# Patient Record
Sex: Female | Born: 1950 | Race: White | Hispanic: No | Marital: Married | Smoking: Never smoker
Health system: Southern US, Community
[De-identification: ages and names within clinical notes are randomized; demographics above are authoritative.]

## PROBLEM LIST (undated history)

## (undated) DIAGNOSIS — K56609 Unspecified intestinal obstruction, unspecified as to partial versus complete obstruction: Secondary | ICD-10-CM

## (undated) DIAGNOSIS — M5020 Other cervical disc displacement, unspecified cervical region: Secondary | ICD-10-CM

## (undated) DIAGNOSIS — M25562 Pain in left knee: Secondary | ICD-10-CM

## (undated) DIAGNOSIS — E039 Hypothyroidism, unspecified: Secondary | ICD-10-CM

## (undated) DIAGNOSIS — C801 Malignant (primary) neoplasm, unspecified: Secondary | ICD-10-CM

## (undated) DIAGNOSIS — K219 Gastro-esophageal reflux disease without esophagitis: Secondary | ICD-10-CM

## (undated) DIAGNOSIS — E119 Type 2 diabetes mellitus without complications: Secondary | ICD-10-CM

## (undated) DIAGNOSIS — M1990 Unspecified osteoarthritis, unspecified site: Secondary | ICD-10-CM

## (undated) DIAGNOSIS — I10 Essential (primary) hypertension: Secondary | ICD-10-CM

## (undated) DIAGNOSIS — G4730 Sleep apnea, unspecified: Secondary | ICD-10-CM

---

## 2005-05-30 ENCOUNTER — Ambulatory Visit: Payer: Self-pay | Admitting: Internal Medicine

## 2005-06-07 ENCOUNTER — Ambulatory Visit: Payer: Self-pay | Admitting: Internal Medicine

## 2005-06-17 ENCOUNTER — Ambulatory Visit: Payer: Self-pay | Admitting: Internal Medicine

## 2005-10-04 ENCOUNTER — Ambulatory Visit: Payer: Self-pay | Admitting: Gastroenterology

## 2005-10-07 ENCOUNTER — Ambulatory Visit: Payer: Self-pay | Admitting: Gastroenterology

## 2006-07-23 ENCOUNTER — Emergency Department (HOSPITAL_COMMUNITY): Admission: EM | Admit: 2006-07-23 | Discharge: 2006-07-23 | Payer: Self-pay | Admitting: Emergency Medicine

## 2006-08-27 ENCOUNTER — Ambulatory Visit: Payer: Self-pay | Admitting: Internal Medicine

## 2006-09-20 ENCOUNTER — Ambulatory Visit: Payer: Self-pay

## 2007-04-13 ENCOUNTER — Ambulatory Visit: Payer: Self-pay | Admitting: Internal Medicine

## 2008-03-15 ENCOUNTER — Ambulatory Visit: Payer: Self-pay | Admitting: Internal Medicine

## 2009-03-29 ENCOUNTER — Ambulatory Visit: Payer: Self-pay | Admitting: Internal Medicine

## 2010-04-04 ENCOUNTER — Ambulatory Visit: Payer: Self-pay | Admitting: Internal Medicine

## 2011-04-25 ENCOUNTER — Inpatient Hospital Stay: Payer: Self-pay | Admitting: Surgery

## 2011-05-08 ENCOUNTER — Inpatient Hospital Stay: Payer: Self-pay | Admitting: Surgery

## 2011-06-11 ENCOUNTER — Observation Stay: Payer: Self-pay | Admitting: Surgery

## 2011-08-21 ENCOUNTER — Ambulatory Visit: Payer: Self-pay | Admitting: Internal Medicine

## 2012-09-23 ENCOUNTER — Ambulatory Visit: Payer: Self-pay | Admitting: Internal Medicine

## 2013-05-12 ENCOUNTER — Ambulatory Visit: Payer: Self-pay | Admitting: Ophthalmology

## 2013-06-02 ENCOUNTER — Ambulatory Visit: Payer: Self-pay | Admitting: Ophthalmology

## 2013-09-28 ENCOUNTER — Ambulatory Visit: Payer: Self-pay | Admitting: Internal Medicine

## 2014-04-28 ENCOUNTER — Inpatient Hospital Stay: Payer: Self-pay | Admitting: Surgery

## 2014-04-28 ENCOUNTER — Ambulatory Visit: Payer: Self-pay | Admitting: Physician Assistant

## 2014-04-28 LAB — COMPREHENSIVE METABOLIC PANEL
ALT: 35 U/L
AST: 26 U/L (ref 15–37)
Albumin: 3.9 g/dL (ref 3.4–5.0)
Alkaline Phosphatase: 84 U/L
Anion Gap: 8 (ref 7–16)
BILIRUBIN TOTAL: 0.4 mg/dL (ref 0.2–1.0)
BUN: 12 mg/dL (ref 7–18)
CALCIUM: 9.1 mg/dL (ref 8.5–10.1)
CO2: 27 mmol/L (ref 21–32)
CREATININE: 0.77 mg/dL (ref 0.60–1.30)
Chloride: 99 mmol/L (ref 98–107)
EGFR (African American): >60
EGFR (Non-African Amer.): >60
GLUCOSE: 115 mg/dL — AB (ref 65–99)
OSMOLALITY: 269 (ref 275–301)
Potassium: 3.8 mmol/L (ref 3.5–5.1)
Sodium: 134 mmol/L — ABNORMAL LOW (ref 136–145)
TOTAL PROTEIN: 7.7 g/dL (ref 6.4–8.2)

## 2014-04-28 LAB — CBC
HCT: 46 % (ref 35.0–47.0)
HGB: 15.1 g/dL (ref 12.0–16.0)
MCH: 29.7 pg (ref 26.0–34.0)
MCHC: 32.7 g/dL (ref 32.0–36.0)
MCV: 91 fL (ref 80–100)
PLATELETS: 223 10*3/uL (ref 150–440)
RBC: 5.07 10*6/uL (ref 3.80–5.20)
RDW: 13.3 % (ref 11.5–14.5)
WBC: 13.4 10*3/uL — AB (ref 3.6–11.0)

## 2014-04-28 LAB — URINALYSIS, COMPLETE
Bacteria: NONE SEEN
Bilirubin,UR: NEGATIVE
Blood: NEGATIVE
Glucose,UR: NEGATIVE mg/dL (ref 0–75)
Ketone: NEGATIVE
Nitrite: NEGATIVE
Ph: 6 (ref 4.5–8.0)
Protein: NEGATIVE
RBC,UR: 1 /HPF (ref 0–5)
Specific Gravity: 1.032 (ref 1.003–1.030)
WBC UR: 2 /HPF (ref 0–5)

## 2014-04-28 LAB — LIPASE, BLOOD: Lipase: 87 U/L (ref 73–393)

## 2014-04-29 LAB — CBC WITH DIFFERENTIAL/PLATELET
BASOS ABS: 0.1 10*3/uL (ref 0.0–0.1)
BASOS PCT: 0.6 %
EOS ABS: 0.2 10*3/uL (ref 0.0–0.7)
EOS PCT: 2.1 %
HCT: 40.8 % (ref 35.0–47.0)
HGB: 12.9 g/dL (ref 12.0–16.0)
LYMPHS ABS: 1.9 10*3/uL (ref 1.0–3.6)
LYMPHS PCT: 20.2 %
MCH: 29.1 pg (ref 26.0–34.0)
MCHC: 31.6 g/dL — AB (ref 32.0–36.0)
MCV: 92 fL (ref 80–100)
MONOS PCT: 9.3 %
Monocyte #: 0.9 x10 3/mm (ref 0.2–0.9)
Neutrophil #: 6.4 10*3/uL (ref 1.4–6.5)
Neutrophil %: 67.8 %
Platelet: 178 10*3/uL (ref 150–440)
RBC: 4.43 10*6/uL (ref 3.80–5.20)
RDW: 13.3 % (ref 11.5–14.5)
WBC: 9.4 10*3/uL (ref 3.6–11.0)

## 2014-04-29 LAB — BASIC METABOLIC PANEL
ANION GAP: 4 — AB (ref 7–16)
BUN: 10 mg/dL (ref 7–18)
CALCIUM: 8 mg/dL — AB (ref 8.5–10.1)
CHLORIDE: 105 mmol/L (ref 98–107)
CO2: 31 mmol/L (ref 21–32)
Creatinine: 0.69 mg/dL (ref 0.60–1.30)
EGFR (African American): >60
EGFR (Non-African Amer.): >60
Glucose: 107 mg/dL — ABNORMAL HIGH (ref 65–99)
OSMOLALITY: 279 (ref 275–301)
Potassium: 3.9 mmol/L (ref 3.5–5.1)
SODIUM: 140 mmol/L (ref 136–145)

## 2014-06-12 ENCOUNTER — Inpatient Hospital Stay: Payer: Self-pay | Admitting: Surgery

## 2014-06-12 LAB — URINALYSIS, COMPLETE
BILIRUBIN, UR: NEGATIVE
BLOOD: NEGATIVE
Bacteria: NONE SEEN
GLUCOSE, UR: NEGATIVE mg/dL (ref 0–75)
LEUKOCYTE ESTERASE: NEGATIVE
NITRITE: NEGATIVE
PH: 6 (ref 4.5–8.0)
Protein: NEGATIVE
RBC,UR: 2 /HPF (ref 0–5)
Specific Gravity: 1.027 (ref 1.003–1.030)
WBC UR: 4 /HPF (ref 0–5)

## 2014-06-12 LAB — CBC WITH DIFFERENTIAL/PLATELET
BASOS ABS: 0.1 10*3/uL (ref 0.0–0.1)
BASOS PCT: 0.6 %
EOS ABS: 0.1 10*3/uL (ref 0.0–0.7)
EOS PCT: 0.7 %
HCT: 46.6 % (ref 35.0–47.0)
HGB: 15.3 g/dL (ref 12.0–16.0)
LYMPHS ABS: 1.1 10*3/uL (ref 1.0–3.6)
LYMPHS PCT: 9.1 %
MCH: 30.1 pg (ref 26.0–34.0)
MCHC: 32.8 g/dL (ref 32.0–36.0)
MCV: 92 fL (ref 80–100)
MONO ABS: 0.5 x10 3/mm (ref 0.2–0.9)
Monocyte %: 4.5 %
NEUTROS ABS: 10.3 10*3/uL — AB (ref 1.4–6.5)
NEUTROS PCT: 85.1 %
PLATELETS: 194 10*3/uL (ref 150–440)
RBC: 5.08 10*6/uL (ref 3.80–5.20)
RDW: 13.2 % (ref 11.5–14.5)
WBC: 12.1 10*3/uL — AB (ref 3.6–11.0)

## 2014-06-12 LAB — COMPREHENSIVE METABOLIC PANEL
ALT: 37 U/L
ANION GAP: 9 (ref 7–16)
AST: 33 U/L (ref 15–37)
Albumin: 4.1 g/dL (ref 3.4–5.0)
Alkaline Phosphatase: 99 U/L
BUN: 18 mg/dL (ref 7–18)
Bilirubin,Total: 0.5 mg/dL (ref 0.2–1.0)
CREATININE: 0.9 mg/dL (ref 0.60–1.30)
Calcium, Total: 9.3 mg/dL (ref 8.5–10.1)
Chloride: 98 mmol/L (ref 98–107)
Co2: 30 mmol/L (ref 21–32)
EGFR (African American): >60
Glucose: 190 mg/dL — ABNORMAL HIGH (ref 65–99)
Osmolality: 281 (ref 275–301)
Potassium: 4.2 mmol/L (ref 3.5–5.1)
SODIUM: 137 mmol/L (ref 136–145)
Total Protein: 8.6 g/dL — ABNORMAL HIGH (ref 6.4–8.2)

## 2014-06-12 LAB — TROPONIN I: Troponin-I: <0.02 ng/mL

## 2014-06-12 LAB — LIPASE, BLOOD: Lipase: 104 U/L (ref 73–393)

## 2014-06-13 LAB — COMPREHENSIVE METABOLIC PANEL
Albumin: 3 g/dL — ABNORMAL LOW (ref 3.4–5.0)
Alkaline Phosphatase: 69 U/L
Anion Gap: 7 (ref 7–16)
BILIRUBIN TOTAL: 0.6 mg/dL (ref 0.2–1.0)
BUN: 14 mg/dL (ref 7–18)
CHLORIDE: 105 mmol/L (ref 98–107)
CREATININE: 0.65 mg/dL (ref 0.60–1.30)
Calcium, Total: 8.1 mg/dL — ABNORMAL LOW (ref 8.5–10.1)
Co2: 30 mmol/L (ref 21–32)
EGFR (African American): >60
Glucose: 112 mg/dL — ABNORMAL HIGH (ref 65–99)
OSMOLALITY: 284 (ref 275–301)
Potassium: 3.9 mmol/L (ref 3.5–5.1)
SGOT(AST): 30 U/L (ref 15–37)
SGPT (ALT): 33 U/L
SODIUM: 142 mmol/L (ref 136–145)
Total Protein: 6.3 g/dL — ABNORMAL LOW (ref 6.4–8.2)

## 2014-06-13 LAB — CBC WITH DIFFERENTIAL/PLATELET
Basophil #: 0 10*3/uL (ref 0.0–0.1)
Basophil %: 0.5 %
EOS ABS: 0.2 10*3/uL (ref 0.0–0.7)
Eosinophil %: 2.6 %
HCT: 39 % (ref 35.0–47.0)
HGB: 13.2 g/dL (ref 12.0–16.0)
LYMPHS ABS: 1.4 10*3/uL (ref 1.0–3.6)
Lymphocyte %: 23.4 %
MCH: 30.6 pg (ref 26.0–34.0)
MCHC: 33.9 g/dL (ref 32.0–36.0)
MCV: 90 fL (ref 80–100)
MONOS PCT: 7.6 %
Monocyte #: 0.4 x10 3/mm (ref 0.2–0.9)
NEUTROS ABS: 3.9 10*3/uL (ref 1.4–6.5)
Neutrophil %: 65.9 %
PLATELETS: 163 10*3/uL (ref 150–440)
RBC: 4.32 10*6/uL (ref 3.80–5.20)
RDW: 12.9 % (ref 11.5–14.5)
WBC: 5.9 10*3/uL (ref 3.6–11.0)

## 2014-11-19 NOTE — Discharge Summary (Signed)
PATIENT NAME:  Susan Chung, Susan Chung MR#:  [id number] DATE OF BIRTH:  04-20-1951  DATE OF ADMISSION:  04/28/2014 DATE OF DISCHARGE:  05/01/2014  BRIEF HISTORY: Ms. Elenes is a 65 year old woman with an abdominal wall hernia repair 2 years ago, complicated by a wound infection. No mesh was placed. She developed a recurrent hernia. She presented to the emergency room with diarrhea, abdominal pain and mild obstructive symptoms. A CT scan was suggestive of an incarcerated hernia. The hernia was reduced in the emergency room, but she was admitted for further evaluation. She was made n.p.o. for the first 12 hours, had no particular abdominal problems. Continued to have some mild diarrhea and was advanced to a liquid diet. She tolerated kit well. She moved to a soft diet with no particular problems. Her hernia remains reduced. She appears to be stable with regard to her abdominal discomfort. We talked about the options for intervention, which would include consideration for repair. She may need evaluation by a body wall reconstruction expert. We will set up a time for her to see [place] in the office.   DISCHARGE MEDICATIONS: Include levothyroxine 125 mcg once a day, omeprazole 20 mg once a day, lisinopril 2.5 mg once a day, meclizine 25 mg 3 times a day, metformin 500 mg b.i.d. and hydrochlorothiazide-triamterene 25/37.5 once a day. She is also discharged home on Vicodin for pain.   FINAL DISCHARGE DIAGNOSES:  1. Incarcerated ventral hernia.  2. Partial small bowel obstruction.  3. Diarrhea.    ____________________________ Micheline Maze, MD rle:TT D: 05/01/2014 09:20:30 ET T: 05/01/2014 16:58:24 ET JOB#: [id number]  cc: Micheline Maze, MD, <Dictator> Adin Hector, MD Rodena Goldmann MD ELECTRONICALLY SIGNED 05/01/2014 17:48

## 2014-11-19 NOTE — H&P (Signed)
Subjective/Chief Complaint Epigastric pain radiating to back x 1 day, diarrhea x 1 day.  N/V x 1 day.   History of Present Illness Susan Chung is a pleasant 64 yo F with a history of incarcerated ventral hernia s/p primary VHR with component separationin 2012 who presents with 2 days of diarrhea followed by abd pain and nausea/vomiting.  Thought initially that diarrhea was secondary to metformin but then developed epigastric pain radiating to back.  Followed by nausea/vomiting.  No vomiting for hours.  Otherwise doing well. rprior to this.  Does report some left sided abdominal pain as well and occasional bulge at area of midline wound but completely nontender in this area.  CT performed which shows some mildly dilated loops of small bowel going into an area of either recurrent herniation vs ? diastasis with contrast passing through.  Also with significant amount of liquid in colon without significant solid stool.   Past History H/o incarcerated ventral hernia repair with component separation H/o incisional hematoma s/p evacuation H/o open cholecystectomy H/o hypothyroidism HTN H/o GERD ? DM   Past Medical Health Hypertension, Cancer   Code Status Full Code   Past Med/Surgical Hx:  Incisional wound infection:   Incisional hematoma:   Hypothyroidism:   GERD - Esophageal Reflux:   Hypertension:   Incarcerated Ventral Hernia:   Open cholecystectomy:   Lysis of adhesions:   Exploratory Laparotomy:   ALLERGIES:  No Known Allergies:   Family and Social History:  Family History Non-Contributory   Social History negative tobacco, negative ETOH   Place of Living Home   Review of Systems:  Subjective/Chief Complaint Epigastric pain, nausea/vomiting/diarrhea   Fever/Chills No   Cough No   Sputum No   Abdominal Pain Yes   Diarrhea Yes   Constipation No   Nausea/Vomiting Yes   SOB/DOE No   Chest Pain No   Dysuria No   Tolerating Diet No  Nauseated  Vomiting    Physical Exam:  GEN well developed, well nourished, no acute distress, obese   HEENT pink conjunctivae, PERRL, hearing intact to voice   RESP normal resp effort  clear BS  no use of accessory muscles   CARD regular rate  no murmur  no thrills   ABD denies tenderness  positive hernia  no hernia  soft  normal BS  +/- hernia, nontender abdomen completely   GU superpubic tenderness   EXTR negative cyanosis/clubbing, negative edema   SKIN normal to palpation, No rashes, No ulcers, skin turgor good   NEURO cranial nerves intact, negative rigidity, negative tremor, strength:, motor/sensory function intact   PSYCH A+O to time, place, person, good insight    Assessment/Admission Diagnosis 64 yo F with history of abdominal hernia repaired primarily in 2012.  Now with epigastric/RUQ pain, nausea/vomiting and diarrhea.  On CT may have hernia but may be more of a diastasis as I think I do see thin fascial layer overlying bowel.  Contrast goes through bowel which is inside of hernia and stomach relatively normal looking.  Does have colon filled with fluid and I am concerned that this is more of a gastroenteritis type picture.  No abdominal pain to palpation whatsoever.   Plan Admit, IVF, nausea control.  I predict that this is self limiting.  Will continue serial abdominal exams.  I feel that there is no need for emergent ventral hernia repair based on imaging and clinical findings and would be nice if this is indeed a hernia to repair electively  so that mesh could be used.   Electronic Signatures: Floyde Parkins (MD)  (Signed 01-Oct-15 21:17)  Authored: CHIEF COMPLAINT and HISTORY, PAST MEDICAL/SURGIAL HISTORY, ALLERGIES, FAMILY AND SOCIAL HISTORY, REVIEW OF SYSTEMS, PHYSICAL EXAM, ASSESSMENT AND PLAN   Last Updated: 01-Oct-15 21:17 by Floyde Parkins (MD)

## 2014-11-19 NOTE — H&P (Signed)
   Subjective/Chief Complaint Abdominal pain, nausea/vomiting   History of Present Illness 64 yo F with history of VH s/p repair with components separation, now recurrent small ventral hernia, now with 2 days of worsening abd pain.  Worse with dinner and followed by N/V.  Last emesis just prior to presentation to ER.  Only small bm over last 2 days.  No fevers/chills.  Currently without significant pain.  May have had area of hardening of hernia which is no longer present.  Well prior to this.  CT shows fecalization of small bowel with multiple transition points ? adhesions.  Contrast not through dilated areas yet.  Plan is for hernia repair in January.   Past History H/o incarcerated VH repair with components separation. H/o incisional hematoma s/p evacuation H/o open chole H/o hypothyroidism HTN GERD   Past Medical Health Hypertension   Code Status Full Code   Past Med/Surgical Hx:  Diabetes:   Incisional wound infection:   Incisional hematoma:   Hypothyroidism:   GERD - Esophageal Reflux:   Hypertension:   Bariatric sleeve:   Hysterectomy - Total:   Incarcerated Ventral Hernia:   Open cholecystectomy:   Lysis of adhesions:   Exploratory Laparotomy:   ALLERGIES:  No Known Allergies:   Family and Social History:  Family History Non-Contributory   Social History negative tobacco, negative ETOH   Place of Living Home   Review of Systems:  Subjective/Chief Complaint Diffuse abd pain, N/V   Fever/Chills No   Cough No   Sputum No   Abdominal Pain Yes   Diarrhea No   Constipation Yes   Nausea/Vomiting Yes   SOB/DOE No   Chest Pain No   Dysuria No   Tolerating Diet No  Nauseated  Vomiting   Physical Exam:  GEN well developed, well nourished, no acute distress, obese   HEENT PERRL, hearing intact to voice, good dentition   RESP normal resp effort  clear BS  no use of accessory muscles   CARD regular rate  no murmur  no thrills  no carotid bruits    ABD positive tenderness  no hernia  soft  normal BS   EXTR negative cyanosis/clubbing, negative edema   SKIN normal to palpation, No rashes, skin turgor good   NEURO cranial nerves intact, follows commands, strength:, motor/sensory function intact   PSYCH A+O to time, place, person, good insight    Assessment/Admission Diagnosis 64 yo F with recurrent VH, now with N/V, abd pain.  CT shows multiple dilated loops of bowel without incarcerated hernia.  Contrast hasnt progressed very far.  My thought is with remote history of hard area in abdomen earlier is that she may have reduced blockage prior to admission and that CT findings are resolving SBO.   Plan Admit, IVF, NPO.  Have recommended NG tube but would like to wait at this time.  If continues to throw up or not improve will readdress.  No need for emergent surgical intervention at this time.  Will obtain KUB in am.   Electronic Signatures: Floyde Parkins (MD)  (Signed [phone] 05:53)  Authored: CHIEF COMPLAINT and HISTORY, PAST MEDICAL/SURGIAL HISTORY, ALLERGIES, FAMILY AND SOCIAL HISTORY, REVIEW OF SYSTEMS, PHYSICAL EXAM, ASSESSMENT AND PLAN   Last Updated: 15-Nov-15 05:53 by Floyde Parkins (MD)

## 2014-11-19 NOTE — Discharge Summary (Signed)
PATIENT NAME:  Susan Chung, Susan Chung MR#:  [id number] DATE OF BIRTH:  Jun 14, 1951  DATE OF ADMISSION:  06/12/2014 DATE OF DISCHARGE:  06/14/2014  DIAGNOSES:  1.  Partial small bowel obstruction, resolved.  2.  Recurrent ventral hernia.  3.  Obesity.   PROCEDURES: None.   HISTORY OF PRESENT ILLNESS AND HOSPITAL COURSE: This is a patient of Dr. Algernon Huxley who was admitted by Dr. Rexene Edison.  She has had a recurrent ventral hernia that was recently repaired by component separation, but has recurred again.  She has an appointment with [facility] for surgical repair in January, that date was of her choosing.  She presented to the hospital with nausea, vomiting, and distention. A nasogastric tube was placed and her small bowel obstruction spontaneously resolved. She was tolerating a regular diet at the time of discharge and instructed to follow up with [facility]. She is actually going to call [facility] to see if she can move up her surgical appointment, she had originally chosen to do this after the holidays, but because of her recent problem with a partial small bowel obstruction she may want to truncate that time. This was discussed with her. She understood and agreed with this plan. No new medications were added.     ____________________________ Jerrol Banana Burt Knack, MD rec:bu D: 06/28/2014 21:11:06 ET T: 06/28/2014 21:22:33 ET JOB#: [id number]  cc: Jerrol Banana. Burt Knack, MD, <Dictator> Florene Glen MD ELECTRONICALLY SIGNED 06/29/2014 2:04

## 2015-06-05 ENCOUNTER — Other Ambulatory Visit: Payer: Self-pay | Admitting: Internal Medicine

## 2015-06-05 DIAGNOSIS — Z1231 Encounter for screening mammogram for malignant neoplasm of breast: Secondary | ICD-10-CM

## 2015-06-12 ENCOUNTER — Ambulatory Visit: Payer: Self-pay

## 2015-06-26 ENCOUNTER — Ambulatory Visit
Admission: RE | Admit: 2015-06-26 | Discharge: 2015-06-26 | Disposition: A | Discharge status: Home / Self Care | Payer: BLUE CROSS/BLUE SHIELD | Source: Ambulatory Visit | Attending: Internal Medicine | Admitting: Internal Medicine

## 2015-06-26 DIAGNOSIS — Z1231 Encounter for screening mammogram for malignant neoplasm of breast: Secondary | ICD-10-CM

## 2015-06-26 HISTORY — DX: Malignant (primary) neoplasm, unspecified: C80.1

## 2015-12-28 ENCOUNTER — Other Ambulatory Visit: Payer: Self-pay | Admitting: Internal Medicine

## 2016-01-01 ENCOUNTER — Other Ambulatory Visit: Payer: Self-pay | Admitting: Gastroenterology

## 2016-01-01 DIAGNOSIS — K562 Volvulus: Secondary | ICD-10-CM

## 2016-01-10 ENCOUNTER — Ambulatory Visit
Admission: RE | Admit: 2016-01-10 | Discharge: 2016-01-10 | Disposition: A | Discharge status: Home / Self Care | Source: Ambulatory Visit | Attending: Gastroenterology | Admitting: Gastroenterology

## 2016-01-10 DIAGNOSIS — K562 Volvulus: Secondary | ICD-10-CM

## 2016-06-10 ENCOUNTER — Other Ambulatory Visit: Payer: Self-pay | Admitting: Internal Medicine

## 2016-06-10 DIAGNOSIS — Z1231 Encounter for screening mammogram for malignant neoplasm of breast: Secondary | ICD-10-CM

## 2016-07-15 ENCOUNTER — Ambulatory Visit
Admission: RE | Admit: 2016-07-15 | Discharge: 2016-07-15 | Disposition: A | Discharge status: Home / Self Care | Payer: BLUE CROSS/BLUE SHIELD | Source: Ambulatory Visit | Attending: Internal Medicine | Admitting: Internal Medicine

## 2016-07-15 DIAGNOSIS — Z1231 Encounter for screening mammogram for malignant neoplasm of breast: Secondary | ICD-10-CM | POA: Diagnosis present

## 2016-08-13 IMAGING — CT CT ABD-PELV W/ CM
2 of 5 series · 16 of 46 positions shown, 18 images · IV contrast (isovue)
Comparison: 04/25/2011

CLINICAL DATA: Left upper quadrant abdominal pain with ileus.
Vomiting. Nausea. Back pain. Prior gastric sleeve surgery. Hernia
repair x2.

EXAM:
CT ABDOMEN AND PELVIS WITH CONTRAST
TECHNIQUE: Multidetector CT imaging of the abdomen and pelvis was performed
using the standard protocol following bolus administration of
intravenous contrast.
CONTRAST:  100 cc Isovue 370

[Series 2: routine abd pel with · axial · 0.77mm/px · z∈[-876,-462]mm · 13 of 93 slices shown, 15 images]
[im 5/93  soft-tissue]
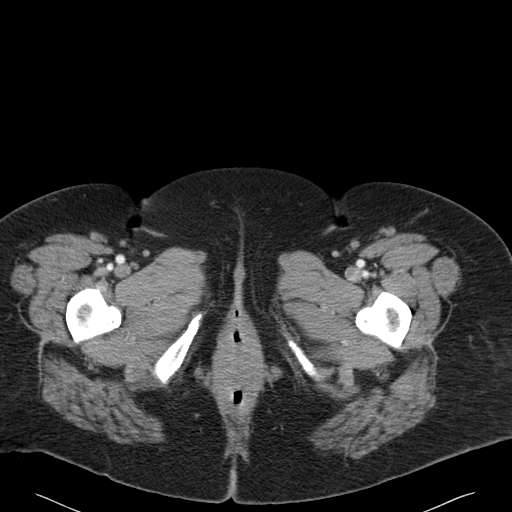
[im 5/93  bone]
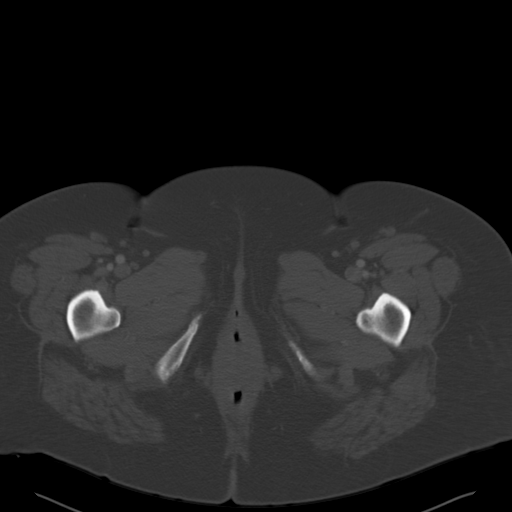
[im 14/93  soft-tissue]
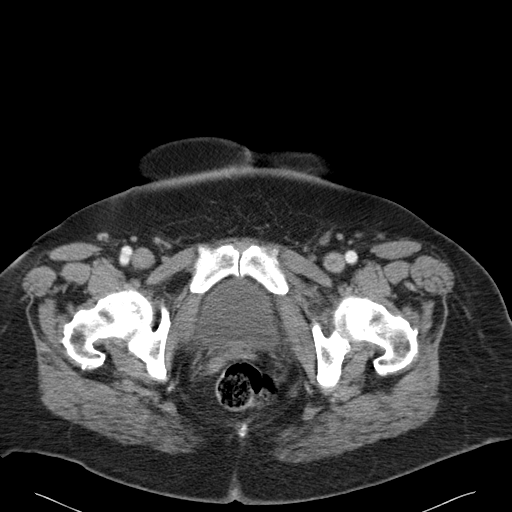
[im 19/93  soft-tissue]
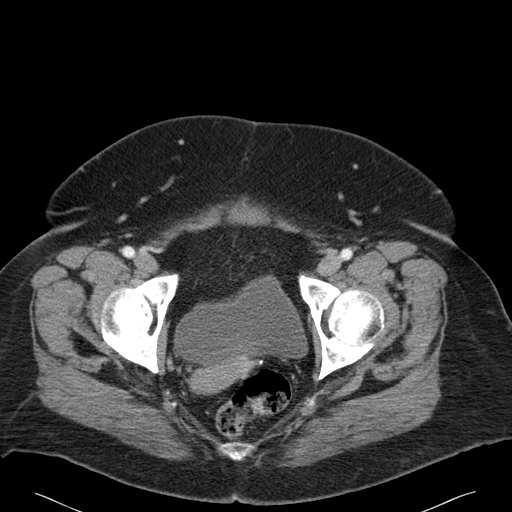
[im 28/93  soft-tissue]
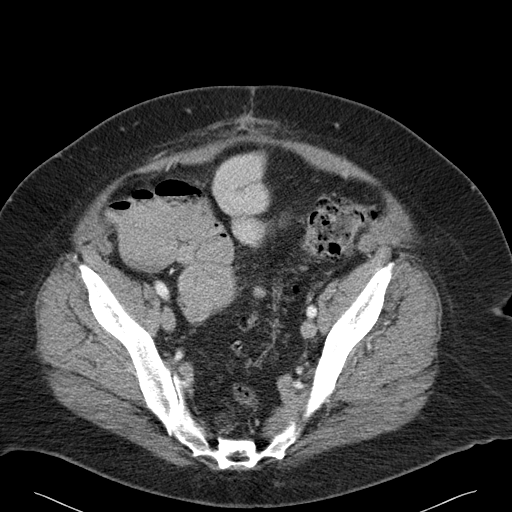
[im 33/93  soft-tissue]
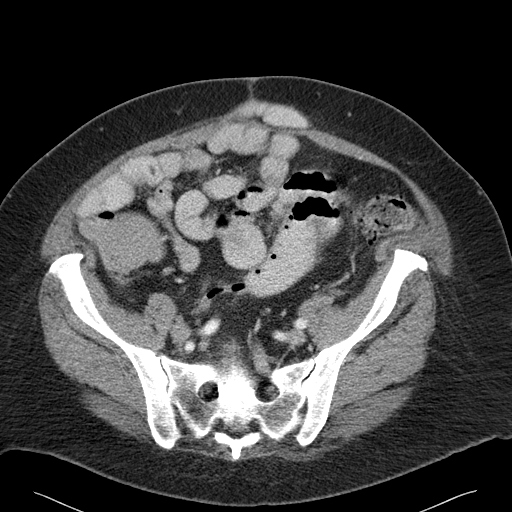
[im 42/93  soft-tissue]
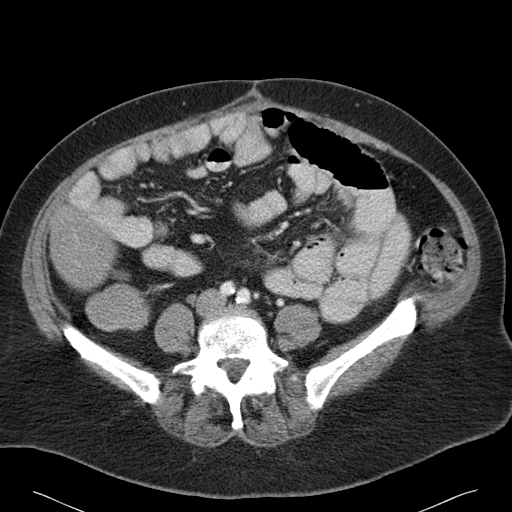
[im 47/93  soft-tissue]
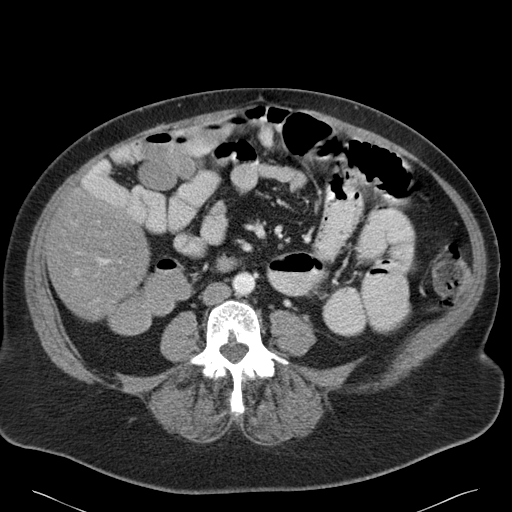
[im 51/93  soft-tissue]
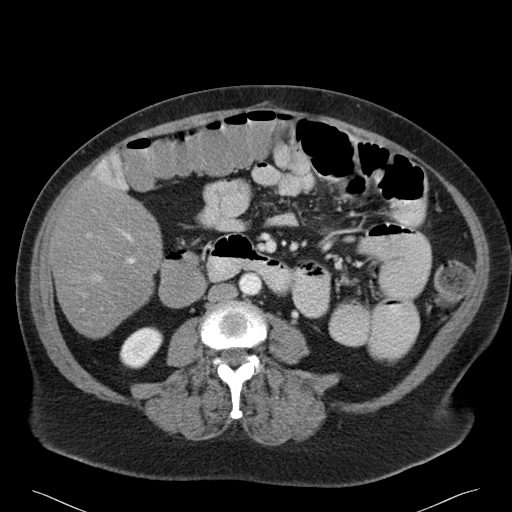
[im 60/93  soft-tissue]
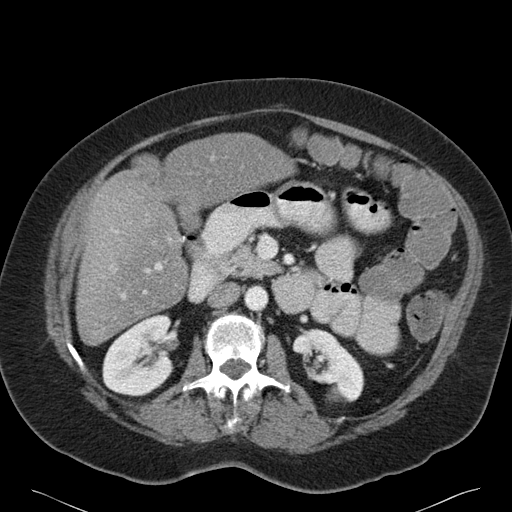
[im 60/93  bone]
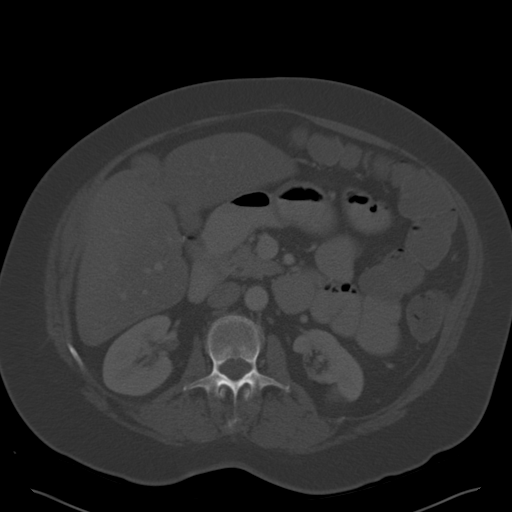
[im 65/93  soft-tissue]
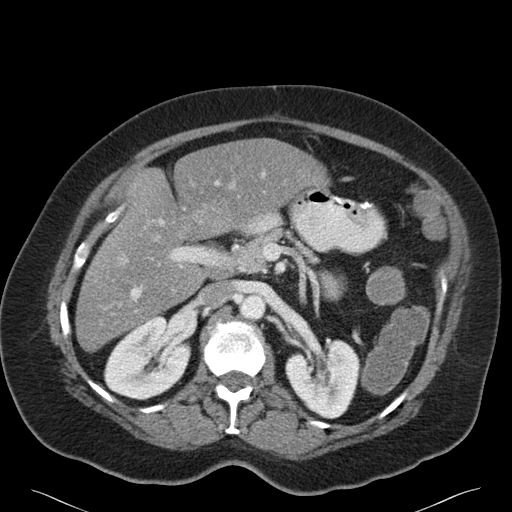
[im 74/93  soft-tissue]
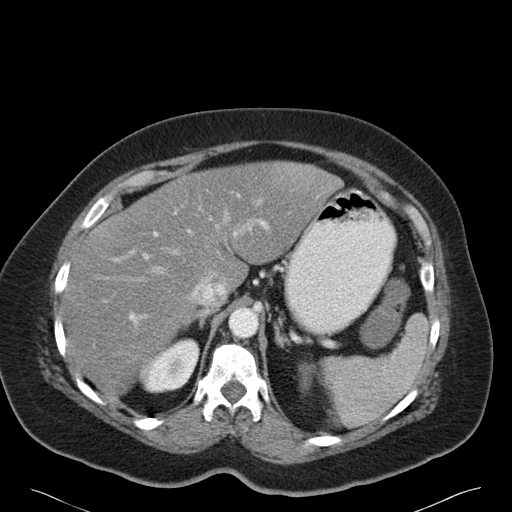
[im 79/93  soft-tissue]
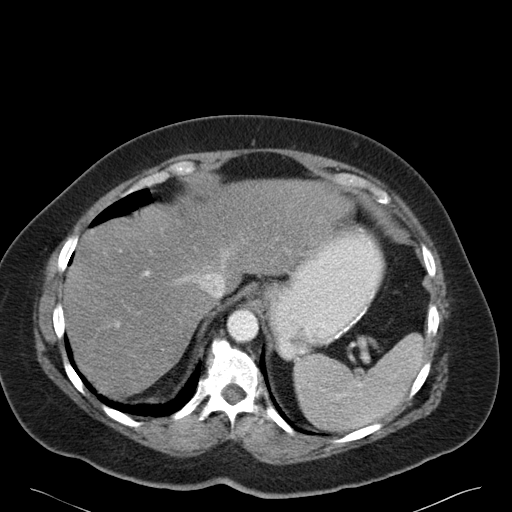
[im 88/93  soft-tissue]
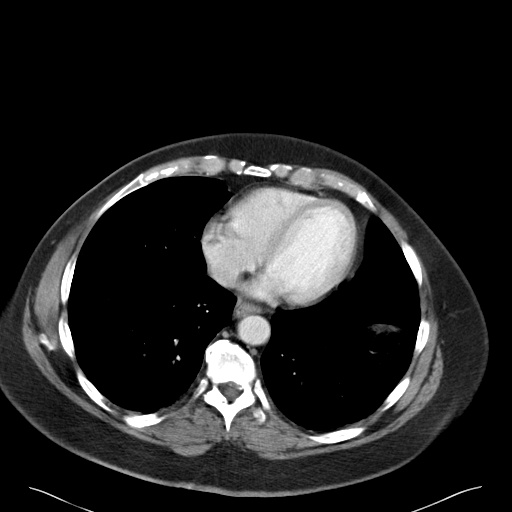

[Series 6: cor routine abd pel with · coronal · 0.82mm/px · 3 of 160 slices shown]
[im 54/160  soft-tissue]
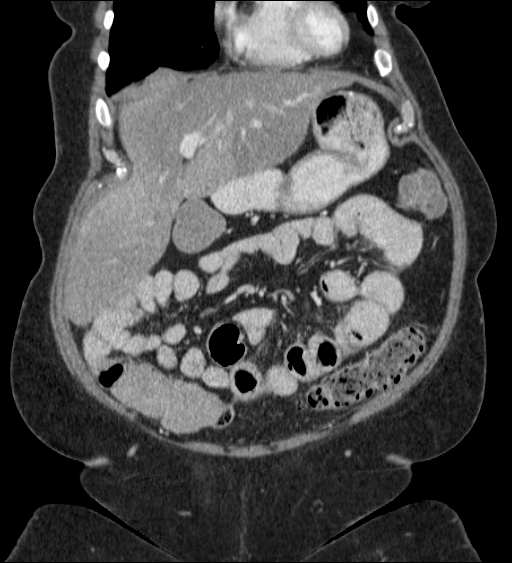
[im 71/160  soft-tissue]
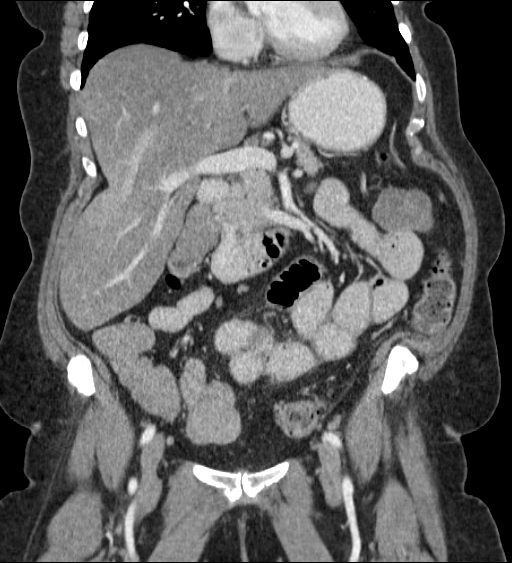
[im 89/160  soft-tissue]
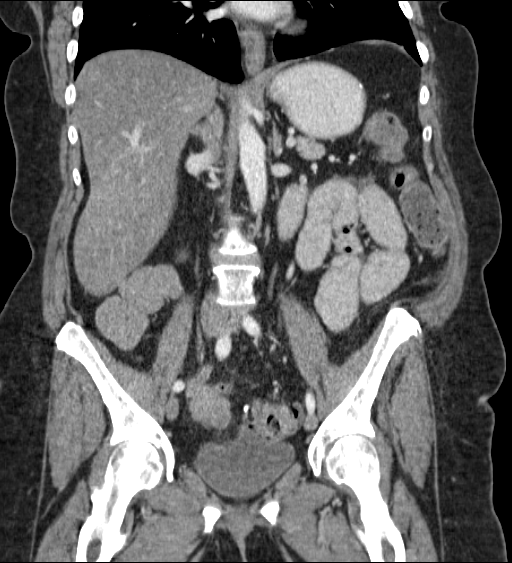

[16 of 46 positions shown; findings below may reference images not displayed]

FINDINGS: Lower chest: Clear lung bases. Normal heart size, without
pericardial or pleural effusion.

Hepatobiliary: Hepatomegaly, 23 cm craniocaudal. Moderate hepatic
steatosis with mild sparing in the posterior aspect of segment 4B
and segment 3. Cholecystectomy, without biliary ductal dilatation.

Pancreas: Normal, without mass or pancreatic ductal dilatation.

Spleen: Normal

Adrenals/Urinary Tract: Mild left adrenal thickening. Normal right
adrenal gland. Scarred interim lower pole left kidney. No
hydronephrosis. Normal urinary bladder.

Stomach/Bowel: Status post gastric sleeve, without acute
complication.

Extensive colonic diverticulosis with muscular hypertrophy involving
the sigmoid. The colon is normal in caliber. Normal terminal ileum
and appendix.

Proximal small bowel loops measure up to 3.9 cm. A left paracentral
ventral abdominal wall hernia contains small bowel on image 58.
Numerous distal small bowel loops are normal in caliber. No evidence
of strangulation.

Vascular/Lymphatic: Aortic and branch vessel atherosclerosis. No
retroperitoneal or retrocrural adenopathy. No pelvic adenopathy.

Reproductive: Retroverted uterus.  No adnexal mass.

Other:  No significant free fluid.

Musculoskeletal: Trace L4-5 anterolisthesis. Degenerative disc
disease at the lumbosacral junction.
IMPRESSION: 1. Left paracentral small bowel containing ventral abdominal wall
hernia with low-grade partial secondary obstruction. No evidence of
strangulation.
2. Hepatomegaly and hepatic steatosis.
3. Left renal scarring.

## 2016-08-14 IMAGING — CR DG ABDOMEN 2V
1 series · 3 of 3 positions shown · non-contrast
Comparison: CT 04/28/2014.

CLINICAL DATA: Evaluate progression of contrast. Small bowel
obstruction.

EXAM:
ABDOMEN - 2 VIEW

[Series 1: dxr abdomen 2 v flat and erect · 0.14mm/px · 3 of 3 slices shown]
[im 1/3]
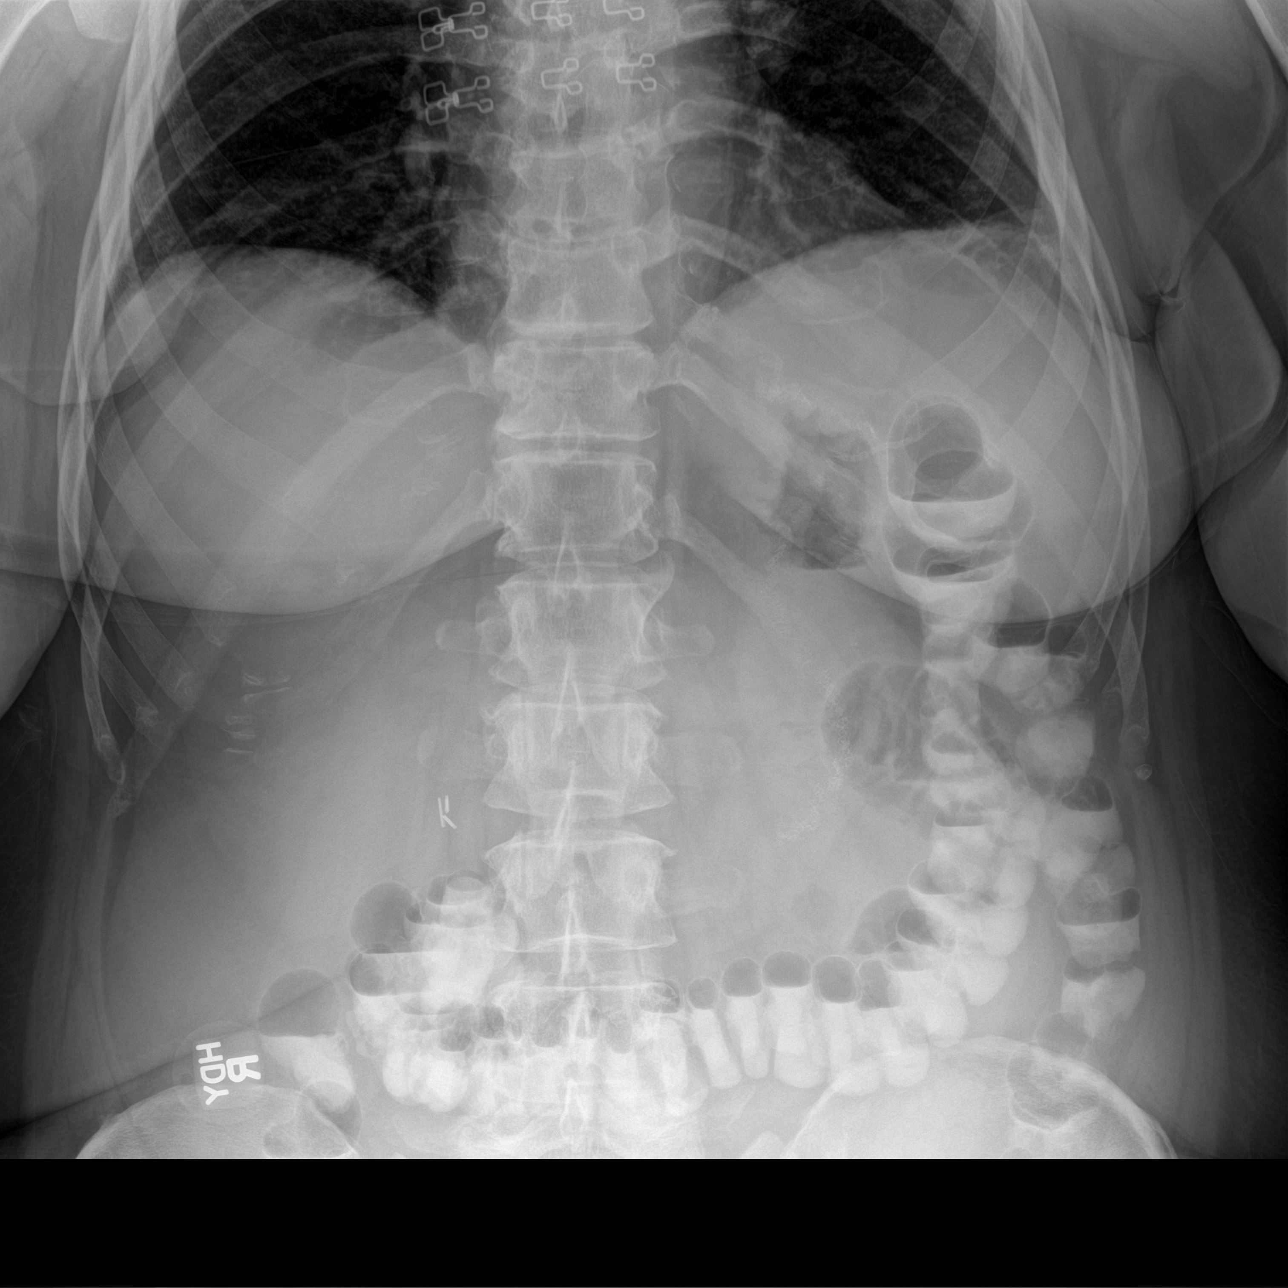
[im 2/3]
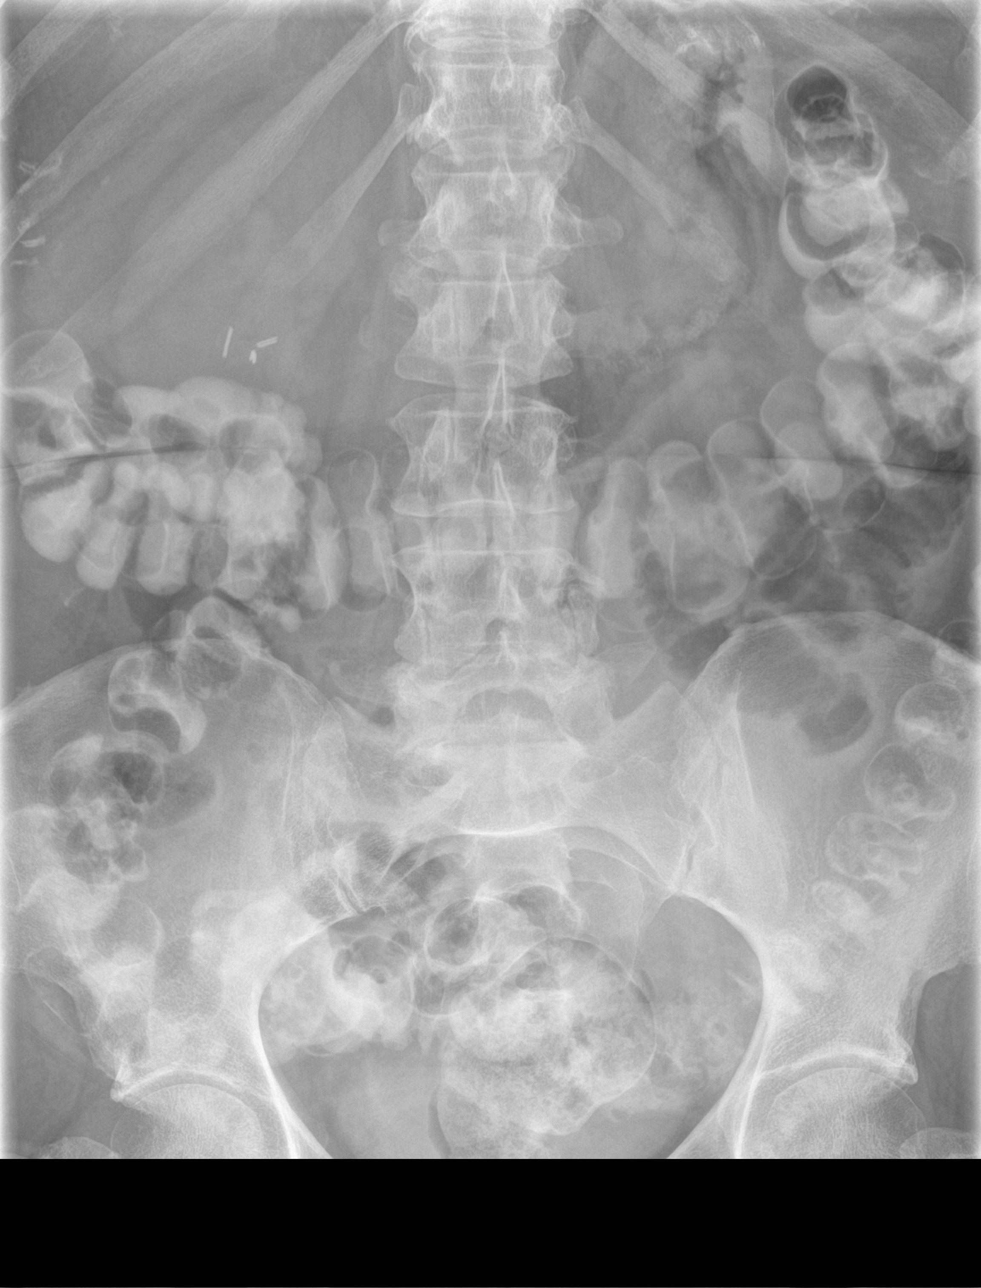
[im 3/3]
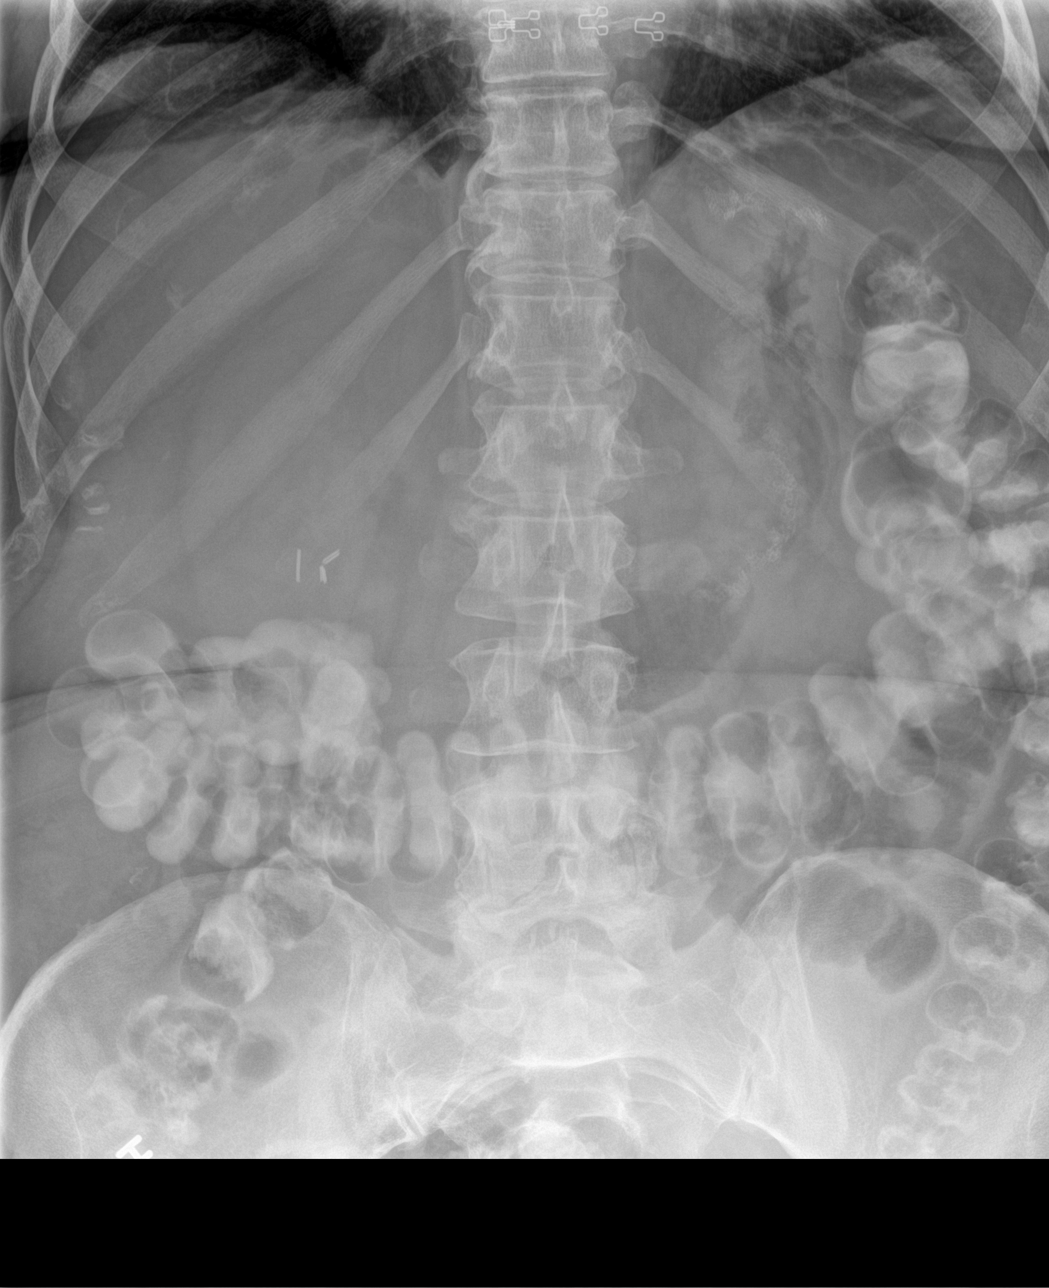

[3 of 3 positions shown; findings below may reference images not displayed]

FINDINGS: Contrast has progressed into the colon and is now present within the
rectum. The appearance is consistent with resolved small bowel
obstruction. Bowel staples are present in the LEFT mid abdomen.
There is mildly dilated loop of small bowel in the LEFT central
abdomen measuring 37 mm. Bowel distention has decreased compared to
the CT scout film. No free air. No organomegaly. Cholecystectomy
clips are present in the right upper quadrant.
IMPRESSION: Resolving small bowel obstruction. Oral contrast has progressed to
the colon and rectum.

## 2016-09-05 ENCOUNTER — Encounter: Payer: Self-pay | Admitting: Emergency Medicine

## 2016-09-05 ENCOUNTER — Emergency Department: Payer: Managed Care, Other (non HMO)

## 2016-09-05 ENCOUNTER — Inpatient Hospital Stay
Admission: EM | Admit: 2016-09-05 | Discharge: 2016-09-07 | DRG: 390 | Disposition: A | Discharge status: Home / Self Care | Payer: Managed Care, Other (non HMO) | Attending: Surgery | Admitting: Surgery

## 2016-09-05 DIAGNOSIS — R109 Unspecified abdominal pain: Secondary | ICD-10-CM | POA: Diagnosis not present

## 2016-09-05 DIAGNOSIS — I10 Essential (primary) hypertension: Secondary | ICD-10-CM | POA: Diagnosis present

## 2016-09-05 DIAGNOSIS — Z90710 Acquired absence of both cervix and uterus: Secondary | ICD-10-CM | POA: Diagnosis not present

## 2016-09-05 DIAGNOSIS — K760 Fatty (change of) liver, not elsewhere classified: Secondary | ICD-10-CM | POA: Diagnosis present

## 2016-09-05 DIAGNOSIS — Z9049 Acquired absence of other specified parts of digestive tract: Secondary | ICD-10-CM | POA: Diagnosis not present

## 2016-09-05 DIAGNOSIS — E039 Hypothyroidism, unspecified: Secondary | ICD-10-CM | POA: Diagnosis present

## 2016-09-05 DIAGNOSIS — Z85828 Personal history of other malignant neoplasm of skin: Secondary | ICD-10-CM | POA: Diagnosis not present

## 2016-09-05 DIAGNOSIS — E119 Type 2 diabetes mellitus without complications: Secondary | ICD-10-CM | POA: Diagnosis present

## 2016-09-05 DIAGNOSIS — Z7984 Long term (current) use of oral hypoglycemic drugs: Secondary | ICD-10-CM

## 2016-09-05 DIAGNOSIS — K56600 Partial intestinal obstruction, unspecified as to cause: Principal | ICD-10-CM | POA: Diagnosis present

## 2016-09-05 DIAGNOSIS — K56609 Unspecified intestinal obstruction, unspecified as to partial versus complete obstruction: Secondary | ICD-10-CM

## 2016-09-05 HISTORY — DX: Type 2 diabetes mellitus without complications: E11.9

## 2016-09-05 HISTORY — DX: Essential (primary) hypertension: I10

## 2016-09-05 LAB — CBC WITH DIFFERENTIAL/PLATELET
BASOS ABS: 0.1 10*3/uL (ref 0–0.1)
BASOS PCT: 1 %
EOS ABS: 0.1 10*3/uL (ref 0–0.7)
EOS PCT: 1 %
HEMATOCRIT: 43.8 % (ref 35.0–47.0)
Hemoglobin: 14.7 g/dL (ref 12.0–16.0)
Lymphocytes Relative: 12 %
Lymphs Abs: 1.4 10*3/uL (ref 1.0–3.6)
MCH: 30.5 pg (ref 26.0–34.0)
MCHC: 33.5 g/dL (ref 32.0–36.0)
MCV: 91 fL (ref 80.0–100.0)
MONO ABS: 0.8 10*3/uL (ref 0.2–0.9)
Monocytes Relative: 6 %
NEUTROS ABS: 9.9 10*3/uL — AB (ref 1.4–6.5)
Neutrophils Relative %: 80 %
PLATELETS: 213 10*3/uL (ref 150–440)
RBC: 4.82 MIL/uL (ref 3.80–5.20)
RDW: 13.3 % (ref 11.5–14.5)
WBC: 12.2 10*3/uL — ABNORMAL HIGH (ref 3.6–11.0)

## 2016-09-05 LAB — COMPREHENSIVE METABOLIC PANEL
ALBUMIN: 4.1 g/dL (ref 3.5–5.0)
ALT: 39 U/L (ref 14–54)
AST: 37 U/L (ref 15–41)
Alkaline Phosphatase: 75 U/L (ref 38–126)
Anion gap: 9 (ref 5–15)
BUN: 17 mg/dL (ref 6–20)
CHLORIDE: 101 mmol/L (ref 101–111)
CO2: 27 mmol/L (ref 22–32)
Calcium: 9.3 mg/dL (ref 8.9–10.3)
Creatinine, Ser: 0.72 mg/dL (ref 0.44–1.00)
GFR calc Af Amer: >60 mL/min (ref >60)
GFR calc non Af Amer: >60 mL/min (ref >60)
GLUCOSE: 159 mg/dL — AB (ref 65–99)
POTASSIUM: 4.1 mmol/L (ref 3.5–5.1)
SODIUM: 137 mmol/L (ref 135–145)
Total Bilirubin: 0.7 mg/dL (ref 0.3–1.2)
Total Protein: 7.6 g/dL (ref 6.5–8.1)

## 2016-09-05 LAB — GLUCOSE, CAPILLARY
GLUCOSE-CAPILLARY: 69 mg/dL (ref 65–99)
GLUCOSE-CAPILLARY: 75 mg/dL (ref 65–99)
GLUCOSE-CAPILLARY: 79 mg/dL (ref 65–99)
GLUCOSE-CAPILLARY: 87 mg/dL (ref 65–99)
Glucose-Capillary: 90 mg/dL (ref 65–99)
Glucose-Capillary: 76 mg/dL (ref 65–99)

## 2016-09-05 LAB — URINALYSIS, COMPLETE (UACMP) WITH MICROSCOPIC
BACTERIA UA: NONE SEEN
BILIRUBIN URINE: NEGATIVE
Glucose, UA: NEGATIVE mg/dL
HGB URINE DIPSTICK: NEGATIVE
KETONES UR: NEGATIVE mg/dL
LEUKOCYTES UA: NEGATIVE
Nitrite: NEGATIVE
PH: 6 (ref 5.0–8.0)
Protein, ur: NEGATIVE mg/dL
Specific Gravity, Urine: 1.044 — ABNORMAL HIGH (ref 1.005–1.030)

## 2016-09-05 LAB — LIPASE, BLOOD: Lipase: 21 U/L (ref 11–51)

## 2016-09-05 MED ORDER — MORPHINE SULFATE (PF) 4 MG/ML IV SOLN
4.0000 mg | Freq: Once | INTRAVENOUS | Status: AC
Start: 2016-09-05 — End: 2016-09-05
  Administered 2016-09-05: 4 mg via INTRAVENOUS
  Filled 2016-09-05: qty 1

## 2016-09-05 MED ORDER — KETOROLAC TROMETHAMINE 30 MG/ML IJ SOLN
30.0000 mg | Freq: Four times a day (QID) | INTRAMUSCULAR | Status: DC
  Administered 2016-09-05 – 2016-09-07 (×8): 30 mg via INTRAVENOUS
  Filled 2016-09-05 (×8): qty 1

## 2016-09-05 MED ORDER — ONDANSETRON HCL 4 MG/2ML IJ SOLN
4.0000 mg | Freq: Once | INTRAMUSCULAR | Status: AC
  Administered 2016-09-05: 4 mg via INTRAVENOUS
  Filled 2016-09-05: qty 2

## 2016-09-05 MED ORDER — ONDANSETRON 4 MG PO TBDP: 4.0000 mg | ORAL_TABLET | Freq: Four times a day (QID) | ORAL | Status: DC | PRN

## 2016-09-05 MED ORDER — IOPAMIDOL (ISOVUE-300) INJECTION 61%
30.0000 mL | Freq: Once | INTRAVENOUS | Status: AC | PRN
  Administered 2016-09-05: 30 mL via ORAL

## 2016-09-05 MED ORDER — HYDRALAZINE HCL 20 MG/ML IJ SOLN: 10.0000 mg | INTRAMUSCULAR | Status: DC | PRN

## 2016-09-05 MED ORDER — LACTATED RINGERS IV SOLN
125.0000 mL/h | INTRAVENOUS | Status: DC
  Administered 2016-09-05 (×2): 125 mL/h via INTRAVENOUS

## 2016-09-05 MED ORDER — SODIUM CHLORIDE 0.9 % IV BOLUS (SEPSIS)
1000.0000 mL | Freq: Once | INTRAVENOUS | Status: AC
  Administered 2016-09-05: 1000 mL via INTRAVENOUS

## 2016-09-05 MED ORDER — ENOXAPARIN SODIUM 40 MG/0.4ML ~~LOC~~ SOLN
40.0000 mg | SUBCUTANEOUS | Status: DC
  Administered 2016-09-05 – 2016-09-07 (×3): 40 mg via SUBCUTANEOUS
  Filled 2016-09-05 (×3): qty 0.4

## 2016-09-05 MED ORDER — HYDROMORPHONE HCL 1 MG/ML IJ SOLN
0.5000 mg | INTRAMUSCULAR | Status: DC | PRN
  Administered 2016-09-05: 0.5 mg via INTRAVENOUS
  Filled 2016-09-05: qty 1

## 2016-09-05 MED ORDER — ONDANSETRON HCL 4 MG/2ML IJ SOLN
4.0000 mg | Freq: Once | INTRAMUSCULAR | Status: AC
Start: 2016-09-05 — End: 2016-09-05
  Administered 2016-09-05: 4 mg via INTRAVENOUS

## 2016-09-05 MED ORDER — HYDROMORPHONE HCL 1 MG/ML IJ SOLN
0.5000 mg | INTRAMUSCULAR | Status: DC | PRN
  Administered 2016-09-05 (×2): 0.5 mg via INTRAVENOUS
  Filled 2016-09-05 (×2): qty 0.5

## 2016-09-05 MED ORDER — ONDANSETRON HCL 4 MG/2ML IJ SOLN
4.0000 mg | Freq: Four times a day (QID) | INTRAMUSCULAR | Status: DC | PRN
  Administered 2016-09-05: 4 mg via INTRAVENOUS
  Filled 2016-09-05: qty 2

## 2016-09-05 MED ORDER — INSULIN ASPART 100 UNIT/ML ~~LOC~~ SOLN: 0.0000 [IU] | SUBCUTANEOUS | Status: DC

## 2016-09-05 MED ORDER — ONDANSETRON HCL 4 MG/2ML IJ SOLN
INTRAMUSCULAR | Status: AC
  Administered 2016-09-05: 4 mg via INTRAVENOUS
  Filled 2016-09-05: qty 2

## 2016-09-05 MED ORDER — IOPAMIDOL (ISOVUE-300) INJECTION 61%
100.0000 mL | Freq: Once | INTRAVENOUS | Status: AC | PRN
  Administered 2016-09-05: 100 mL via INTRAVENOUS

## 2016-09-05 MED ORDER — PANTOPRAZOLE SODIUM 40 MG IV SOLR
40.0000 mg | Freq: Every day | INTRAVENOUS | Status: DC
  Administered 2016-09-05: 40 mg via INTRAVENOUS
  Filled 2016-09-05: qty 40

## 2016-09-05 NOTE — H&P (Signed)
Date of Admission:  09/05/2016  Reason for Admission:  Small bowel obstruction  History of Present Illness: Susan Chung is a 66 y.o. female who presents with a one-day history of abdominal pain with nausea. Patient reports that she started having pain yesterday afternoon around 5 PM. She reports that this was more like cramping sensation. This continued over the course of the night around 3 in the morning woke her up from sleep and was severe enough that she presented to the emergency room for further valuation. The patient reports that this type of pain is similar to the prior episodes that she has had of small bowel obstruction. She has had nausea but no emesis. She did have a bowel movement yesterday morning but otherwise no flatus last night her today. Denies any fevers or chills, chest pain or shortness of breath, dysuria or hematuria, or blood in the stools.  Past Medical History: Past Medical History:  Diagnosis Date  . 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 Diabetes mellitus without complication ([place])   . Hypertension Hypothyroidism      Past Surgical History: --C-section x 2 in 1976 and 1979 --Hysterectomy 2004 --Sleeve gastrectomy 2009 --Ventral hernia repair and cholecystectomy 2012 --Ventral hernia repair 2015   Home Medications: Prior to Admission medications   Medication Sig Start Date End Date Taking? Authorizing Provider  acetaminophen (TYLENOL) 650 MG CR tablet Take 1,300 mg by mouth 2 (two) times daily.   Yes Historical Provider, MD  docusate sodium (COLACE) 100 MG capsule Take 100 mg by mouth as needed.   Yes Historical Provider, MD  levothyroxine (SYNTHROID, LEVOTHROID) 137 MCG tablet Take 137 mcg by mouth daily. 05/20/16  Yes Historical Provider, MD  lisinopril (PRINIVIL,ZESTRIL) 2.5 MG tablet Take 2.5 mg by mouth daily. 08/25/16  Yes Historical Provider, MD  loperamide (IMODIUM A-D) 2 MG tablet Take 2 mg by mouth as needed.   Yes  Historical Provider, MD  meclizine (ANTIVERT) 25 MG tablet Take 12.5-25 mg by mouth 3 (three) times daily as needed. 05/18/15  Yes Historical Provider, MD  metFORMIN (GLUCOPHAGE) 500 MG tablet Take 500 mg by mouth 2 (two) times daily. 01/22/16  Yes Historical Provider, MD  omeprazole (PRILOSEC) 20 MG capsule Take 20 mg by mouth daily. 08/25/16  Yes Historical Provider, MD  tiZANidine (ZANAFLEX) 4 MG tablet Take 2-4 mg by mouth 3 (three) times daily as needed. 06/13/16  Yes Historical Provider, MD  traMADol (ULTRAM) 50 MG tablet Take 50 mg by mouth every 6 (six) hours as needed. for pain 07/27/16  Yes Historical Provider, MD    Allergies: No Known Allergies  Social History:  reports that she has never smoked. She has never used smokeless tobacco. She reports that she does not drink alcohol. Her drug history is not on file.   Family History: History reviewed. No pertinent family history.  Review of Systems: Review of Systems  Constitutional: Negative for chills and fever.  HENT: Negative for hearing loss.   Eyes: Negative for blurred vision.  Respiratory: Negative for cough and shortness of breath.   Cardiovascular: Negative for chest pain and leg swelling.  Gastrointestinal: Positive for abdominal pain and nausea. Negative for blood in stool, constipation, diarrhea, heartburn and vomiting.  Genitourinary: Negative for dysuria and hematuria.  Musculoskeletal: Negative for myalgias.  Skin: Negative for rash.  Neurological: Negative for dizziness.  Psychiatric/Behavioral: Negative for depression.  All other systems reviewed and are negative.   Physical Exam  BP (!) 142/71 (BP Location: Left Arm)   Pulse (!) 58   Temp 97.4 F (36.3 C) (Oral)   Resp 20   Ht 5\' 3"  (1.6 m)   Wt 93 kg (205 lb)   SpO2 98%   BMI 36.31 kg/m  CONSTITUTIONAL: No acute distress HEENT:  Normocephalic, atraumatic, extraocular motion intact. NECK: Trachea is midline, and there is no jugular venous  distension.  RESPIRATORY:  Lungs are clear, and breath sounds are equal bilaterally. Normal respiratory effort without pathologic use of accessory muscles. CARDIOVASCULAR: Heart is regular without murmurs, gallops, or rubs. GI: The abdomen is soft, minimally distended, with tenderness to palpation in the left mid abdomen. No peritoneal signs. There were no palpable masses. There is no hernia recurrence. Scars from her surgeries are well-healed. MUSCULOSKELETAL:  Normal muscle strength and tone in all four extremities.  No peripheral edema or cyanosis. SKIN: Skin turgor is normal. There are no pathologic skin lesions.  NEUROLOGIC:  Motor and sensation is grossly normal.  Cranial nerves are grossly intact. PSYCH:  Alert and oriented to person, place and time. Affect is normal.  Laboratory Analysis: Results for orders placed or performed during the hospital encounter of 09/05/16 (from the past 24 hour(s))  CBC with Differential     Status: Abnormal   Collection Time: 09/05/16  4:20 AM  Result Value Ref Range   WBC 12.2 (H) 3.6 - 11.0 K/uL   RBC 4.82 3.80 - 5.20 MIL/uL   Hemoglobin 14.7 12.0 - 16.0 g/dL   HCT 43.8 35.0 - 47.0 %   MCV 91.0 80.0 - 100.0 fL   MCH 30.5 26.0 - 34.0 pg   MCHC 33.5 32.0 - 36.0 g/dL   RDW 13.3 11.5 - 14.5 %   Platelets 213 150 - 440 K/uL   Neutrophils Relative % 80 %   Neutro Abs 9.9 (H) 1.4 - 6.5 K/uL   Lymphocytes Relative 12 %   Lymphs Abs 1.4 1.0 - 3.6 K/uL   Monocytes Relative 6 %   Monocytes Absolute 0.8 0.2 - 0.9 K/uL   Eosinophils Relative 1 %   Eosinophils Absolute 0.1 0 - 0.7 K/uL   Basophils Relative 1 %   Basophils Absolute 0.1 0 - 0.1 K/uL  Comprehensive metabolic panel     Status: Abnormal   Collection Time: 09/05/16  4:20 AM  Result Value Ref Range   Sodium 137 135 - 145 mmol/L   Potassium 4.1 3.5 - 5.1 mmol/L   Chloride 101 101 - 111 mmol/L   CO2 27 22 - 32 mmol/L   Glucose, Bld 159 (H) 65 - 99 mg/dL   BUN 17 6 - 20 mg/dL   Creatinine,  Ser 0.72 0.44 - 1.00 mg/dL   Calcium 9.3 8.9 - 10.3 mg/dL   Total Protein 7.6 6.5 - 8.1 g/dL   Albumin 4.1 3.5 - 5.0 g/dL   AST 37 15 - 41 U/L   ALT 39 14 - 54 U/L   Alkaline Phosphatase 75 38 - 126 U/L   Total Bilirubin 0.7 0.3 - 1.2 mg/dL   GFR calc non Af Amer >60 >60 mL/min   GFR calc Af Amer >60 >60 mL/min   Anion gap 9 5 - 15  Lipase, blood     Status: None   Collection Time: 09/05/16  4:20 AM  Result Value Ref Range   Lipase 21 11 - 51 U/L  Glucose, capillary     Status: None   Collection Time: 09/05/16 10:02 AM  Result Value Ref Range   Glucose-Capillary 87 65 - 99 mg/dL    Imaging: Ct Abdomen Pelvis W Contrast  Result Date: 09/05/2016 CLINICAL DATA:  Left-sided abdominal pain, cramping and vomiting since yesterday. Previous history of bowel obstruction. EXAM: CT ABDOMEN AND PELVIS WITH CONTRAST TECHNIQUE: Multidetector CT imaging of the abdomen and pelvis was performed using the standard protocol following bolus administration of intravenous contrast. CONTRAST:  158mL ISOVUE-300 IOPAMIDOL (ISOVUE-300) INJECTION 61% COMPARISON:  01/10/2016 FINDINGS: Lower chest: Normal Hepatobiliary: Chronic fatty change of the liver with some areas of sparing. Previous cholecystectomy. Pancreas: Normal Spleen: Normal Adrenals/Urinary Tract: Adrenal glands are normal. Chronic scarring and atrophy of the inferior pole of left kidney. No cyst, mass, stone or hydronephrosis. Stomach/Bowel: Findings consistent with partial small bowel obstruction. Relative prominence of the proximal small intestine with more normal caliber distal small bowel. No acute colon finding. Diverticulosis without evidence of diverticulitis. Vascular/Lymphatic: Aortic atherosclerosis. No aneurysm. IVC is normal. No retroperitoneal adenopathy. Reproductive: Previous hysterectomy.  No pelvic mass. Other: No free fluid or air.  Previous abdominal wall surgery. Musculoskeletal: Lower lumbar degenerative changes including 5 mm  anterolisthesis L4-5. IMPRESSION: Findings consistent with partial small bowel obstruction, mild to moderate in degree. Transition zone is probably in the mid ileum. Hepatic steatosis. Electronically Signed   By: Nelson Chimes M.D.   On: 09/05/2016 07:11    Assessment and Plan: This is a 66 y.o. female who presents with a partial small bowel obstruction. I have personally reviewed the patient's laboratory and imaging studies. The patient has mildly dilated small bowel loops with no specific transition point.  -Patient will be admitted to the Gen. surgery team. She will be nothing by mouth with IV fluid hydration. Given the only mild dilation of the small bowel and currently no vomiting, there is no strong indication for an NG tube at this moment. However the patient knows that if she were to have any vomiting or worsening pain that an NG tube would be indicated and the patient is agreeable to that. -Patient will have appropriate nausea and pain medication her essential medications from home will be converted to IV. -We'll attempt conservative management over the next couple days. The patient is aware that if there is no improvement that she may require surgery for her bowel obstruction.   Melvyn Neth, [place]

## 2016-09-05 NOTE — Progress Notes (Signed)
Dr. Adonis Huguenin notified of CBG 69; NPO, IVF LR; Acknowledged, new orders written. Barbaraann Faster, RN 11:59 PM 09/05/2016

## 2016-09-05 NOTE — ED Notes (Signed)
CT called about contrast. Patient has not started on 2nd bottle yet, stated she was feeling sick on her stomach. I reminded her small sips. Will continue to monitor.

## 2016-09-05 NOTE — ED Provider Notes (Signed)
[facility] Emergency Department Provider Note   ____________________________________________   I have reviewed the triage vital signs and the nursing notes.   HISTORY  Chief Complaint Abdominal Pain and Nausea   History limited by: Not Limited   HPI Susan Chung is a 66 y.o. female who presents to the emergency department today because of concerns for abdominal pain. The pain started yesterday. It is located primarily in the center and left side. It does come and go. When it cramps up it will become severe. The patient has had some nausea but no vomiting with it. States last bowel movement was yesterday morning. Has not been passing gas since the pain started. Patient has a history of multiple small bowel obstructions and has had multiple abdominal surgeries. Patient denies any fevers.   Past Medical History:  Diagnosis Date  . 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 Diabetes mellitus without complication ([place])     There are no active problems to display for this patient.   History reviewed. No pertinent surgical history.  Prior to Admission medications   Not on File    Allergies Patient has no known allergies.  History reviewed. No pertinent family history.  Social History Social History  Substance Use Topics  . Smoking status: Never Smoker  . Smokeless tobacco: Never Used  . Alcohol use No    Review of Systems  Constitutional: Negative for fever. Cardiovascular: Negative for chest pain. Respiratory: Negative for shortness of breath. Gastrointestinal: Positive for abdominal pain. Positive for nausea.  Genitourinary: Negative for dysuria. Neurological: Negative for headaches, focal weakness or numbness.  10-point ROS otherwise negative.  ____________________________________________   PHYSICAL EXAM:  VITAL SIGNS: ED Triage Vitals  Enc Vitals Group     BP 09/05/16 0402 (!) 160/89     Pulse Rate  09/05/16 0402 77     Resp 09/05/16 0402 20     Temp 09/05/16 0402 97.5 F (36.4 C)     Temp Source 09/05/16 0402 Oral     SpO2 09/05/16 0402 97 %     Weight 09/05/16 0402 205 lb (93 kg)     Height 09/05/16 0402 5\' 3"  (1.6 m)     Head Circumference --      Peak Flow --      Pain Score 09/05/16 0404 8    Constitutional: Alert and oriented. Appears uncomfortable.  Eyes: Conjunctivae are normal. Normal extraocular movements. ENT   Head: Normocephalic and atraumatic.   Nose: No congestion/rhinnorhea.   Mouth/Throat: Mucous membranes are moist.   Neck: No stridor. Hematological/Lymphatic/Immunilogical: No cervical lymphadenopathy. Cardiovascular: Normal rate, regular rhythm.  No murmurs, rubs, or gallops.  Respiratory: Normal respiratory effort without tachypnea nor retractions. Breath sounds are clear and equal bilaterally. No wheezes/rales/rhonchi. Gastrointestinal: Soft. Tender to palpation somewhat diffusely but worse in the center.  Genitourinary: Deferred Musculoskeletal: Normal range of motion in all extremities. No lower extremity edema. Neurologic:  Normal speech and language. No gross focal neurologic deficits are appreciated.  Skin:  Skin is warm, dry and intact. No rash noted. Psychiatric: Mood and affect are normal. Speech and behavior are normal. Patient exhibits appropriate insight and judgment.  ____________________________________________    LABS (pertinent positives/negatives)  Labs Reviewed  CBC WITH DIFFERENTIAL/PLATELET - Abnormal; Notable for the following:       Result Value   WBC 12.2 (*)    Neutro Abs 9.9 (*)    All other components within normal  limits  COMPREHENSIVE METABOLIC PANEL - Abnormal; Notable for the following:    Glucose, Bld 159 (*)    All other components within normal limits  LIPASE, BLOOD  URINALYSIS, COMPLETE (UACMP) WITH MICROSCOPIC      ____________________________________________   EKG  None  ____________________________________________    RADIOLOGY  CT abd/pel  IMPRESSION: Findings consistent with partial small bowel obstruction, mild to moderate in degree. Transition zone is probably in the mid ileum.  Hepatic steatosis.   ____________________________________________   PROCEDURES  Procedures  ____________________________________________   INITIAL IMPRESSION / ASSESSMENT AND PLAN / ED COURSE  Pertinent labs & imaging results that were available during my care of the patient were reviewed by me and considered in my medical decision making (see chart for details).  Patient presents to the emergency department because of abdominal pain. Patient has a history of multiple small bowel obstructions in the past and states this feels similar. CT scan is consistent with a partial small bowel obstruction. Will have surgery come and evaluate the patient.  ____________________________________________   FINAL CLINICAL IMPRESSION(S) / ED DIAGNOSES  Final diagnoses:  Partial small bowel obstruction     Note: This dictation was prepared with Dragon dictation. Any transcriptional errors that result from this process are unintentional     Nance Pear, MD 09/05/16 [phone]

## 2016-09-05 NOTE — ED Notes (Signed)
Attempted IV x 1 unsuccessful.

## 2016-09-05 NOTE — ED Notes (Signed)
Patient has had 2 hernia repairs, gallbladder removal, bariatric sleeve.

## 2016-09-05 NOTE — ED Triage Notes (Signed)
Pt ambulatory to triage in NAD, report left abdominal cramping and nausea w/o vomiting since yesterday, report hx of bowel obstruction

## 2016-09-05 NOTE — ED Notes (Signed)
ED Provider at bedside. 

## 2016-09-06 ENCOUNTER — Inpatient Hospital Stay: Payer: Managed Care, Other (non HMO)

## 2016-09-06 LAB — BASIC METABOLIC PANEL
ANION GAP: 4 — AB (ref 5–15)
BUN: 14 mg/dL (ref 6–20)
CALCIUM: 8.7 mg/dL — AB (ref 8.9–10.3)
CO2: 31 mmol/L (ref 22–32)
CREATININE: 0.54 mg/dL (ref 0.44–1.00)
Chloride: 104 mmol/L (ref 101–111)
GLUCOSE: 105 mg/dL — AB (ref 65–99)
Potassium: 4.6 mmol/L (ref 3.5–5.1)
Sodium: 139 mmol/L (ref 135–145)

## 2016-09-06 LAB — GLUCOSE, CAPILLARY
GLUCOSE-CAPILLARY: 81 mg/dL (ref 65–99)
GLUCOSE-CAPILLARY: 85 mg/dL (ref 65–99)
Glucose-Capillary: 108 mg/dL — ABNORMAL HIGH (ref 65–99)
Glucose-Capillary: 110 mg/dL — ABNORMAL HIGH (ref 65–99)
Glucose-Capillary: 135 mg/dL — ABNORMAL HIGH (ref 65–99)

## 2016-09-06 LAB — CBC WITH DIFFERENTIAL/PLATELET
BASOS PCT: 1 %
Basophils Absolute: 0 10*3/uL (ref 0–0.1)
EOS ABS: 0.1 10*3/uL (ref 0–0.7)
EOS PCT: 1 %
HCT: 39.9 % (ref 35.0–47.0)
HEMOGLOBIN: 13.2 g/dL (ref 12.0–16.0)
Lymphocytes Relative: 29 %
Lymphs Abs: 1.5 10*3/uL (ref 1.0–3.6)
MCH: 30.4 pg (ref 26.0–34.0)
MCHC: 33.1 g/dL (ref 32.0–36.0)
MCV: 91.8 fL (ref 80.0–100.0)
MONOS PCT: 7 %
Monocytes Absolute: 0.4 10*3/uL (ref 0.2–0.9)
NEUTROS PCT: 62 %
Neutro Abs: 3.3 10*3/uL (ref 1.4–6.5)
PLATELETS: 172 10*3/uL (ref 150–440)
RBC: 4.35 MIL/uL (ref 3.80–5.20)
RDW: 13.4 % (ref 11.5–14.5)
WBC: 5.3 10*3/uL (ref 3.6–11.0)

## 2016-09-06 LAB — MAGNESIUM: Magnesium: 2 mg/dL (ref 1.7–2.4)

## 2016-09-06 MED ORDER — PANTOPRAZOLE SODIUM 40 MG PO TBEC
40.0000 mg | DELAYED_RELEASE_TABLET | Freq: Every day | ORAL | Status: DC
  Administered 2016-09-06 – 2016-09-07 (×2): 40 mg via ORAL
  Filled 2016-09-06 (×2): qty 1

## 2016-09-06 MED ORDER — LISINOPRIL 5 MG PO TABS
2.5000 mg | ORAL_TABLET | Freq: Every day | ORAL | Status: DC
  Administered 2016-09-06 – 2016-09-07 (×2): 2.5 mg via ORAL
  Filled 2016-09-06 (×2): qty 1

## 2016-09-06 MED ORDER — KCL IN DEXTROSE-NACL 20-5-0.45 MEQ/L-%-% IV SOLN
INTRAVENOUS | Status: DC
  Administered 2016-09-06 (×2): via INTRAVENOUS
  Filled 2016-09-06 (×5): qty 1000

## 2016-09-06 MED ORDER — INSULIN ASPART 100 UNIT/ML ~~LOC~~ SOLN
0.0000 [IU] | Freq: Three times a day (TID) | SUBCUTANEOUS | Status: DC
  Administered 2016-09-06: 2 [IU] via SUBCUTANEOUS
  Filled 2016-09-06: qty 2

## 2016-09-06 MED ORDER — LEVOTHYROXINE SODIUM 25 MCG PO TABS
137.0000 ug | ORAL_TABLET | ORAL | Status: DC
  Administered 2016-09-06 – 2016-09-07 (×2): 137 ug via ORAL
  Filled 2016-09-06 (×2): qty 1

## 2016-09-06 NOTE — Progress Notes (Signed)
09/06/2016  Subjective: No acute events.  Patient reports she had flatus overnight.  Reports her pain has also improved.  No further nausea.  Vital signs: Temp:  [97.4 F (36.3 C)-98.3 F (36.8 C)] 98.3 F (36.8 C) (02/09 0806) Pulse Rate:  [52-58] 56 (02/09 0806) Resp:  [17-20] 20 (02/09 0806) BP: (126-148)/(60-80) 143/70 (02/09 0806) SpO2:  [94 %-100 %] 100 % (02/09 0806)   Intake/Output: 02/08 0701 - 02/09 0700 In: 2539 [I.V.:2539] Out: 200 [Urine:200] Last BM Date: 09/04/16  Physical Exam: Constitutional: No acute distress Abdomen: Soft, nondistended, with only some soreness on the left side of the abdomen. No peritoneal signs.  Labs:   Recent Labs  09/05/16 0420 09/06/16 0406  WBC 12.2* 5.3  HGB 14.7 13.2  HCT 43.8 39.9  PLT 213 172    Recent Labs  09/05/16 0420 09/06/16 0406  NA 137 139  K 4.1 4.6  CL 101 104  CO2 27 31  GLUCOSE 159* 105*  BUN 17 14  CREATININE 0.72 0.54  CALCIUM 9.3 8.7*   No results for input(s): LABPROT, INR in the last 72 hours.  Imaging: Dg Abd 1 View  Result Date: 09/06/2016 CLINICAL DATA:  Abdominal pain and nausea for 2 days EXAM: ABDOMEN - 1 VIEW COMPARISON:  Abdominal radiographs June 13, 2014; CT abdomen and pelvis September 05, 2016 FINDINGS: Contrast is seen in the colon. There is currently no bowel dilatation or air-fluid levels suggesting bowel obstruction. No evident free air. There are surgical clips in right upper quadrant. There is evidence of mesh in the mid lower abdominal regions. IMPRESSION: Contrast in colon.  No demonstrable bowel obstruction or free air. Electronically Signed   By: Lowella Grip III M.D.   On: 09/06/2016 08:42    Assessment/Plan: 66 year old female with resolving small bowel obstruction.  -Start clear liquid diet today. -Discontinue IV fluids later today as she is tolerating appropriate by mouth intake. -May be able to advance to regular diet for tonight versus tomorrow morning with  plans for discharge to home tomorrow.   Melvyn Neth, [place]

## 2016-09-07 LAB — GLUCOSE, CAPILLARY: Glucose-Capillary: 94 mg/dL (ref 65–99)

## 2016-09-07 MED ORDER — POLYETHYLENE GLYCOL 3350 17 G PO PACK: 17.0000 g | PACK | Freq: Every day | ORAL | 0 refills | Status: DC

## 2016-09-07 MED ORDER — OXYCODONE HCL 5 MG PO TABS: 5.0000 mg | ORAL_TABLET | Freq: Four times a day (QID) | ORAL | 0 refills | Status: DC | PRN

## 2016-09-07 MED ORDER — POLYETHYLENE GLYCOL 3350 17 G PO PACK
17.0000 g | PACK | Freq: Every day | ORAL | Status: DC
  Administered 2016-09-07: 17 g via ORAL
  Filled 2016-09-07: qty 1

## 2016-09-07 NOTE — Progress Notes (Signed)
09/07/2016  Subjective: No acute events overnight.  Patient tolerated clears well and was advanced to diabetic diet.  Continues with flatus.  No nausea or vomiting.  Vital signs: Temp:  [97.5 F (36.4 C)-97.8 F (36.6 C)] 97.5 F (36.4 C) (02/10 0752) Pulse Rate:  [50-57] 50 (02/10 0752) Resp:  [17-26] 26 (02/10 0752) BP: (113-150)/(63-77) 149/77 (02/10 0752) SpO2:  [99 %-100 %] 100 % (02/10 0752)   Intake/Output: 02/09 0701 - 02/10 0700 In: 1316 [P.O.:477; I.V.:839] Out: -  Last BM Date: 09/06/16  Physical Exam: Constitutional: No acute distress Abdomen: soft, nondistended, nontender to palpation.  Labs:   Recent Labs  09/05/16 0420 09/06/16 0406  WBC 12.2* 5.3  HGB 14.7 13.2  HCT 43.8 39.9  PLT 213 172    Recent Labs  09/05/16 0420 09/06/16 0406  NA 137 139  K 4.1 4.6  CL 101 104  CO2 27 31  GLUCOSE 159* 105*  BUN 17 14  CREATININE 0.72 0.54  CALCIUM 9.3 8.7*   No results for input(s): LABPROT, INR in the last 72 hours.  Imaging: No results found.  Assessment/Plan: 66 yo female with SBO, now resolved.  --Continue diabetic diet today. --Miralax for bowel regimen. --Discharge to home this morning.   Susan Chung, [place]

## 2016-09-07 NOTE — Progress Notes (Signed)
Patient discharged this shift; denies pain at this time; discharge instruction as ordered; discharge via w/c by staff.

## 2016-09-07 NOTE — Discharge Summary (Signed)
Patient ID: Susan Chung MRN: [id number] DOB/AGE: [birth year] 66 y.o.  Admit date: 09/05/2016 Discharge date: 09/07/2016   Discharge Diagnoses:  Active Problems:   Partial small bowel obstruction   Procedures: None  Hospital Course: Patient was admitted on 09/05/16 with a partial small bowel obstruction.  She was made NPO with IV fluid hydration.  She had return of bowel function on 09/06/16 and was started on clear liquid diet.  Her diet was advanced as tolerated to regular diet.  Her pain resolved.  She was deemed ready for discharge on 2/10.  Consults: None  Disposition:  Home, self-care  Discharge Instructions    Call MD for:  difficulty breathing, headache or visual disturbances    Complete by:  As directed    Call MD for:  persistant nausea and vomiting    Complete by:  As directed    Call MD for:  severe uncontrolled pain    Complete by:  As directed    Call MD for:  temperature >100.4    Complete by:  As directed    Diet - low sodium heart healthy    Complete by:  As directed    Discharge instructions    Complete by:  As directed    May take MiraLax for constipation.  May follow up with [place] on an as need basis.  Or may follow up with your surgeon at [facility].   Driving Restrictions    Complete by:  As directed    Do not drive if taking narcotics for pain control.   Increase activity slowly    Complete by:  As directed      Allergies as of 09/07/2016   No Known Allergies     Medication List    STOP taking these medications   loperamide 2 MG tablet Commonly known as:  IMODIUM A-D     TAKE these medications   acetaminophen 650 MG CR tablet Commonly known as:  TYLENOL Take 1,300 mg by mouth 2 (two) times daily.   docusate sodium 100 MG capsule Commonly known as:  COLACE Take 100 mg by mouth as needed.   levothyroxine 137 MCG tablet Commonly known as:  SYNTHROID, LEVOTHROID Take 137 mcg by mouth daily.   lisinopril 2.5 MG tablet Commonly known as:   PRINIVIL,ZESTRIL Take 2.5 mg by mouth daily.   meclizine 25 MG tablet Commonly known as:  ANTIVERT Take 12.5-25 mg by mouth 3 (three) times daily as needed.   metFORMIN 500 MG tablet Commonly known as:  GLUCOPHAGE Take 500 mg by mouth 2 (two) times daily.   omeprazole 20 MG capsule Commonly known as:  PRILOSEC Take 20 mg by mouth daily.   oxyCODONE 5 MG immediate release tablet Commonly known as:  Oxy IR/ROXICODONE Take 1-2 tablets (5-10 mg total) by mouth every 6 (six) hours as needed for severe pain.   polyethylene glycol packet Commonly known as:  MIRALAX / GLYCOLAX Take 17 g by mouth daily. Start taking on:  09/08/2016   tiZANidine 4 MG tablet Commonly known as:  ZANAFLEX Take 2-4 mg by mouth 3 (three) times daily as needed.   traMADol 50 MG tablet Commonly known as:  ULTRAM Take 50 mg by mouth every 6 (six) hours as needed. for pain      Follow-up Information    Olean Ree, MD. Call.   Specialty:  Surgery Why:  May follow up with [place] on an as needed basis. Contact information: [place]  [place] [address] [phone]

## 2017-03-11 ENCOUNTER — Other Ambulatory Visit: Payer: Self-pay | Admitting: Physical Medicine and Rehabilitation

## 2017-03-11 DIAGNOSIS — M5412 Radiculopathy, cervical region: Secondary | ICD-10-CM

## 2017-03-18 ENCOUNTER — Ambulatory Visit: Payer: Managed Care, Other (non HMO)

## 2017-03-21 ENCOUNTER — Ambulatory Visit: Payer: Managed Care, Other (non HMO)

## 2017-04-04 ENCOUNTER — Ambulatory Visit
Admission: RE | Admit: 2017-04-04 | Discharge: 2017-04-04 | Disposition: A | Discharge status: Home / Self Care | Payer: Managed Care, Other (non HMO) | Source: Ambulatory Visit | Attending: Physical Medicine and Rehabilitation | Admitting: Physical Medicine and Rehabilitation

## 2017-04-04 DIAGNOSIS — M47812 Spondylosis without myelopathy or radiculopathy, cervical region: Secondary | ICD-10-CM | POA: Insufficient documentation

## 2017-04-04 DIAGNOSIS — M5412 Radiculopathy, cervical region: Secondary | ICD-10-CM

## 2017-04-04 DIAGNOSIS — M50321 Other cervical disc degeneration at C4-C5 level: Secondary | ICD-10-CM | POA: Diagnosis not present

## 2017-06-03 ENCOUNTER — Other Ambulatory Visit: Payer: Self-pay | Admitting: Internal Medicine

## 2017-06-03 DIAGNOSIS — Z1231 Encounter for screening mammogram for malignant neoplasm of breast: Secondary | ICD-10-CM

## 2017-06-09 ENCOUNTER — Encounter: Payer: Self-pay | Admitting: Emergency Medicine

## 2017-06-09 ENCOUNTER — Other Ambulatory Visit: Payer: Self-pay

## 2017-06-09 ENCOUNTER — Emergency Department: Payer: Managed Care, Other (non HMO)

## 2017-06-09 ENCOUNTER — Emergency Department
Admission: EM | Admit: 2017-06-09 | Discharge: 2017-06-09 | Disposition: A | Discharge status: Home / Self Care | Payer: Managed Care, Other (non HMO) | Attending: Emergency Medicine | Admitting: Emergency Medicine

## 2017-06-09 DIAGNOSIS — Y9389 Activity, other specified: Secondary | ICD-10-CM | POA: Insufficient documentation

## 2017-06-09 DIAGNOSIS — Y999 Unspecified external cause status: Secondary | ICD-10-CM | POA: Insufficient documentation

## 2017-06-09 DIAGNOSIS — E119 Type 2 diabetes mellitus without complications: Secondary | ICD-10-CM | POA: Diagnosis not present

## 2017-06-09 DIAGNOSIS — I10 Essential (primary) hypertension: Secondary | ICD-10-CM | POA: Diagnosis not present

## 2017-06-09 DIAGNOSIS — S199XXA Unspecified injury of neck, initial encounter: Secondary | ICD-10-CM | POA: Diagnosis present

## 2017-06-09 DIAGNOSIS — Y92410 Unspecified street and highway as the place of occurrence of the external cause: Secondary | ICD-10-CM | POA: Insufficient documentation

## 2017-06-09 DIAGNOSIS — S161XXA Strain of muscle, fascia and tendon at neck level, initial encounter: Secondary | ICD-10-CM | POA: Diagnosis not present

## 2017-06-09 DIAGNOSIS — M25511 Pain in right shoulder: Secondary | ICD-10-CM | POA: Insufficient documentation

## 2017-06-09 DIAGNOSIS — R0789 Other chest pain: Secondary | ICD-10-CM | POA: Diagnosis not present

## 2017-06-09 DIAGNOSIS — Z7984 Long term (current) use of oral hypoglycemic drugs: Secondary | ICD-10-CM | POA: Insufficient documentation

## 2017-06-09 DIAGNOSIS — Z79899 Other long term (current) drug therapy: Secondary | ICD-10-CM | POA: Diagnosis not present

## 2017-06-09 DIAGNOSIS — R0781 Pleurodynia: Secondary | ICD-10-CM

## 2017-06-09 HISTORY — DX: Other cervical disc displacement, unspecified cervical region: M50.20

## 2017-06-09 MED ORDER — TRAMADOL HCL 50 MG PO TABS
50.0000 mg | ORAL_TABLET | Freq: Once | ORAL | Status: AC
  Administered 2017-06-09: 50 mg via ORAL
  Filled 2017-06-09: qty 1
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
## 2017-06-09 NOTE — ED Notes (Signed)

## 2017-06-09 NOTE — Discharge Instructions (Signed)
Follow-up with your  primary care provider if any continued problems. Continue taking tramadol as needed for pain. You  may use ice or heat to your  neck and shoulder as needed for discomfort.
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
## 2017-07-17 ENCOUNTER — Ambulatory Visit
Admission: RE | Admit: 2017-07-17 | Discharge: 2017-07-17 | Disposition: A | Discharge status: Home / Self Care | Payer: Managed Care, Other (non HMO) | Source: Ambulatory Visit | Attending: Internal Medicine | Admitting: Internal Medicine

## 2017-07-17 DIAGNOSIS — Z1231 Encounter for screening mammogram for malignant neoplasm of breast: Secondary | ICD-10-CM | POA: Insufficient documentation

## 2017-09-04 ENCOUNTER — Other Ambulatory Visit: Payer: Self-pay | Admitting: Internal Medicine

## 2017-09-04 DIAGNOSIS — R42 Dizziness and giddiness: Secondary | ICD-10-CM

## 2017-09-12 ENCOUNTER — Ambulatory Visit
Admission: RE | Admit: 2017-09-12 | Discharge: 2017-09-12 | Disposition: A | Discharge status: Home / Self Care | Payer: Managed Care, Other (non HMO) | Source: Ambulatory Visit | Attending: Internal Medicine | Admitting: Internal Medicine

## 2017-09-12 DIAGNOSIS — R42 Dizziness and giddiness: Secondary | ICD-10-CM | POA: Diagnosis present

## 2017-09-12 DIAGNOSIS — I6782 Cerebral ischemia: Secondary | ICD-10-CM | POA: Insufficient documentation

## 2017-09-12 DIAGNOSIS — R200 Anesthesia of skin: Secondary | ICD-10-CM | POA: Insufficient documentation

## 2017-09-12 MED ORDER — GADOBENATE DIMEGLUMINE 529 MG/ML IV SOLN
20.0000 mL | Freq: Once | INTRAVENOUS | Status: AC | PRN
  Administered 2017-09-12: 20 mL via INTRAVENOUS

## 2017-11-10 DIAGNOSIS — G4733 Obstructive sleep apnea (adult) (pediatric): Secondary | ICD-10-CM | POA: Insufficient documentation

## 2017-11-25 ENCOUNTER — Encounter: Payer: Self-pay | Admitting: Cardiology

## 2017-11-25 ENCOUNTER — Encounter
Admission: RE | Disposition: A | Discharge status: Home / Self Care | Payer: Self-pay | Source: Ambulatory Visit | Attending: Cardiology

## 2017-11-25 ENCOUNTER — Ambulatory Visit
Admission: RE | Admit: 2017-11-25 | Discharge: 2017-11-25 | Disposition: A | Discharge status: Home / Self Care | Payer: Managed Care, Other (non HMO) | Source: Ambulatory Visit | Attending: Cardiology | Admitting: Cardiology

## 2017-11-25 DIAGNOSIS — Z79890 Hormone replacement therapy (postmenopausal): Secondary | ICD-10-CM | POA: Diagnosis not present

## 2017-11-25 DIAGNOSIS — G4730 Sleep apnea, unspecified: Secondary | ICD-10-CM | POA: Insufficient documentation

## 2017-11-25 DIAGNOSIS — Z87891 Personal history of nicotine dependence: Secondary | ICD-10-CM | POA: Diagnosis not present

## 2017-11-25 DIAGNOSIS — Z8673 Personal history of transient ischemic attack (TIA), and cerebral infarction without residual deficits: Secondary | ICD-10-CM | POA: Insufficient documentation

## 2017-11-25 DIAGNOSIS — E039 Hypothyroidism, unspecified: Secondary | ICD-10-CM | POA: Insufficient documentation

## 2017-11-25 DIAGNOSIS — Z7982 Long term (current) use of aspirin: Secondary | ICD-10-CM | POA: Insufficient documentation

## 2017-11-25 DIAGNOSIS — Z6836 Body mass index (BMI) 36.0-36.9, adult: Secondary | ICD-10-CM | POA: Insufficient documentation

## 2017-11-25 DIAGNOSIS — Z79899 Other long term (current) drug therapy: Secondary | ICD-10-CM | POA: Diagnosis not present

## 2017-11-25 DIAGNOSIS — Z85828 Personal history of other malignant neoplasm of skin: Secondary | ICD-10-CM | POA: Diagnosis not present

## 2017-11-25 DIAGNOSIS — I4891 Unspecified atrial fibrillation: Secondary | ICD-10-CM | POA: Diagnosis present

## 2017-11-25 DIAGNOSIS — Z7984 Long term (current) use of oral hypoglycemic drugs: Secondary | ICD-10-CM | POA: Diagnosis not present

## 2017-11-25 DIAGNOSIS — Z823 Family history of stroke: Secondary | ICD-10-CM | POA: Insufficient documentation

## 2017-11-25 DIAGNOSIS — E119 Type 2 diabetes mellitus without complications: Secondary | ICD-10-CM | POA: Insufficient documentation

## 2017-11-25 DIAGNOSIS — E782 Mixed hyperlipidemia: Secondary | ICD-10-CM | POA: Insufficient documentation

## 2017-11-25 DIAGNOSIS — I10 Essential (primary) hypertension: Secondary | ICD-10-CM | POA: Diagnosis not present

## 2017-11-25 DIAGNOSIS — Z8249 Family history of ischemic heart disease and other diseases of the circulatory system: Secondary | ICD-10-CM | POA: Diagnosis not present

## 2017-11-25 DIAGNOSIS — Z9884 Bariatric surgery status: Secondary | ICD-10-CM | POA: Diagnosis not present

## 2017-11-25 HISTORY — PX: LOOP RECORDER INSERTION: EP1214

## 2017-11-25 SURGERY — LOOP RECORDER INSERTION
Anesthesia: Moderate Sedation

## 2017-11-25 MED ORDER — LIDOCAINE-EPINEPHRINE (PF) 1 %-1:200000 IJ SOLN
INTRAMUSCULAR | Status: AC
  Filled 2017-11-25: qty 30

## 2017-11-25 SURGICAL SUPPLY — 2 items
LOOP REVEAL LINQSYS (Prosthesis & Implant Heart) ×3 IMPLANT
PACK LOOP INSERTION (CUSTOM PROCEDURE TRAY) ×3 IMPLANT

## 2017-11-25 NOTE — Discharge Instructions (Signed)
Implantable Loop Recorder Placement, Care After  Refer to this sheet in the next few weeks. These instructions provide you with information about caring for yourself after your procedure. Your health care provider may also give you more specific instructions. Your treatment has been planned according to current medical practices, but problems sometimes occur. Call your health care provider if you have any problems or questions after your procedure.  What can I expect after the procedure?  After the procedure, it is common to have:   Soreness or pain near the cut from surgery (incision).   Some swelling or bruising near the incision.    Follow these instructions at home:  Medicines   Take over-the-counter and prescription medicines only as told by your health care provider.   If you were prescribed an antibiotic medicine, take it as told by your health care provider. Do not stop taking the antibiotic even if you start to feel better.  Bathing   Do not take baths, swim, or use a hot tub until your health care provider approves. Ask your health care provider if you can take showers. You may only be allowed to take sponge baths for bathing.  Incision care   Follow instructions from your health care provider about how to take care of your incision. Make sure you:  ? Wash your hands with soap and water before you change your bandage (dressing). If soap and water are not available, use hand sanitizer.  ? Change your dressing as told by your health care provider.  ? Keep your dressing dry.  ? Leave stitches (sutures), skin glue, or adhesive strips in place. These skin closures may need to stay in place for 2 weeks or longer. If adhesive strip edges start to loosen and curl up, you may trim the loose edges. Do not remove adhesive strips completely unless your health care provider tells you to do that.   Check your incision area every day for signs of infection. Check for:  ? More redness, swelling, or pain.  ? Fluid  or blood.  ? Warmth.  ? Pus or a bad smell.  Driving   If you received a sedative, do not drive for 24 hours after the procedure.   If you did not receive a sedative, ask your health care provider when it is safe to drive.  Activity   Return to your normal activities as told by your health care provider. Ask your health care provider what activities are safe for you.   Until your health care provider says it is safe:  ? Do not lift anything that is heavier than 10 lb (4.5 kg).  ? Do not do activities that involve lifting your arms over your head.  General instructions     Follow instructions from your health care provider about how and when to use your implantable loop recorder.   Do not go through a metal detection gate, and do not let someone hold a metal detector over your chest. Show your ID card.   Do not have an MRI unless you check with your health care provider first.   Do not use any tobacco products, such as cigarettes, chewing tobacco, and e-cigarettes. Tobacco can delay healing. If you need help quitting, ask your health care provider.   Keep all follow-up visits as told by your health care provider. This is important.  Contact a health care provider if:   You have more redness, swelling, or pain around your incision.     You have more fluid or blood coming from your incision.   Your incision feels warm to the touch.   You have pus or a bad smell coming from your incision.   You have a fever.   You have pain that is not relieved by your pain medicine.   You have triggered your device because of fainting (syncope) or because of a heartbeat that feels like it is racing, slow, fluttering, or skipping (palpitations).  Get help right away if:   You have chest pain.   You have difficulty breathing.  This information is not intended to replace advice given to you by your health care provider. Make sure you discuss any questions you have with your health care provider.  Document Released:  06/26/2015 Document Revised: 12/21/2015 Document Reviewed: 04/19/2015  Elsevier Interactive Patient Education  2018 [employer]

## 2018-08-10 ENCOUNTER — Emergency Department

## 2018-08-10 ENCOUNTER — Emergency Department
Admission: EM | Admit: 2018-08-10 | Discharge: 2018-08-10 | Disposition: A | Discharge status: Home / Self Care | Attending: Emergency Medicine | Admitting: Emergency Medicine

## 2018-08-10 ENCOUNTER — Other Ambulatory Visit: Payer: Self-pay

## 2018-08-10 DIAGNOSIS — Z7984 Long term (current) use of oral hypoglycemic drugs: Secondary | ICD-10-CM | POA: Insufficient documentation

## 2018-08-10 DIAGNOSIS — I10 Essential (primary) hypertension: Secondary | ICD-10-CM | POA: Insufficient documentation

## 2018-08-10 DIAGNOSIS — Z79899 Other long term (current) drug therapy: Secondary | ICD-10-CM | POA: Insufficient documentation

## 2018-08-10 DIAGNOSIS — Z95818 Presence of other cardiac implants and grafts: Secondary | ICD-10-CM | POA: Insufficient documentation

## 2018-08-10 DIAGNOSIS — K5732 Diverticulitis of large intestine without perforation or abscess without bleeding: Secondary | ICD-10-CM

## 2018-08-10 DIAGNOSIS — E119 Type 2 diabetes mellitus without complications: Secondary | ICD-10-CM | POA: Insufficient documentation

## 2018-08-10 DIAGNOSIS — Z85828 Personal history of other malignant neoplasm of skin: Secondary | ICD-10-CM | POA: Insufficient documentation

## 2018-08-10 HISTORY — DX: Unspecified intestinal obstruction, unspecified as to partial versus complete obstruction: K56.609

## 2018-08-10 LAB — LIPASE, BLOOD: Lipase: 22 U/L (ref 11–51)

## 2018-08-10 LAB — CBC
HCT: 40 % (ref 36.0–46.0)
HEMOGLOBIN: 13 g/dL (ref 12.0–15.0)
MCH: 29.7 pg (ref 26.0–34.0)
MCHC: 32.5 g/dL (ref 30.0–36.0)
MCV: 91.5 fL (ref 80.0–100.0)
PLATELETS: 249 10*3/uL (ref 150–400)
RBC: 4.37 MIL/uL (ref 3.87–5.11)
RDW: 12.7 % (ref 11.5–15.5)
WBC: 10 10*3/uL (ref 4.0–10.5)
nRBC: 0 % (ref 0.0–0.2)

## 2018-08-10 LAB — COMPREHENSIVE METABOLIC PANEL
ALBUMIN: 4.3 g/dL (ref 3.5–5.0)
ALK PHOS: 90 U/L (ref 38–126)
ALT: 24 U/L (ref 0–44)
AST: 24 U/L (ref 15–41)
Anion gap: 10 (ref 5–15)
BILIRUBIN TOTAL: 0.6 mg/dL (ref 0.3–1.2)
BUN: 12 mg/dL (ref 8–23)
CALCIUM: 9.4 mg/dL (ref 8.9–10.3)
CO2: 28 mmol/L (ref 22–32)
CREATININE: 0.58 mg/dL (ref 0.44–1.00)
Chloride: 99 mmol/L (ref 98–111)
GFR calc Af Amer: >60 mL/min (ref >60)
GFR calc non Af Amer: >60 mL/min (ref >60)
GLUCOSE: 153 mg/dL — AB (ref 70–99)
Potassium: 4 mmol/L (ref 3.5–5.1)
Sodium: 137 mmol/L (ref 135–145)
Total Protein: 7.7 g/dL (ref 6.5–8.1)

## 2018-08-10 LAB — URINALYSIS, COMPLETE (UACMP) WITH MICROSCOPIC
BILIRUBIN URINE: NEGATIVE
Bacteria, UA: NONE SEEN
GLUCOSE, UA: NEGATIVE mg/dL
HGB URINE DIPSTICK: NEGATIVE
Ketones, ur: NEGATIVE mg/dL
Leukocytes, UA: NEGATIVE
NITRITE: NEGATIVE
PH: 7 (ref 5.0–8.0)
Protein, ur: NEGATIVE mg/dL
SPECIFIC GRAVITY, URINE: 1.006 (ref 1.005–1.030)

## 2018-08-10 MED ORDER — CIPROFLOXACIN HCL 500 MG PO TABS
500.0000 mg | ORAL_TABLET | Freq: Once | ORAL | Status: AC
Start: 2018-08-10 — End: 2018-08-10
  Administered 2018-08-10: 500 mg via ORAL
  Filled 2018-08-10: qty 1

## 2018-08-10 MED ORDER — IOPAMIDOL (ISOVUE-300) INJECTION 61%
100.0000 mL | Freq: Once | INTRAVENOUS | Status: AC | PRN
  Administered 2018-08-10: 100 mL via INTRAVENOUS

## 2018-08-10 MED ORDER — ONDANSETRON 4 MG PO TBDP
4.0000 mg | ORAL_TABLET | Freq: Once | ORAL | Status: AC
Start: 2018-08-10 — End: 2018-08-10
  Administered 2018-08-10: 4 mg via ORAL
  Filled 2018-08-10: qty 1

## 2018-08-10 MED ORDER — METRONIDAZOLE 500 MG PO TABS
500.0000 mg | ORAL_TABLET | Freq: Once | ORAL | Status: AC
  Administered 2018-08-10: 500 mg via ORAL
  Filled 2018-08-10: qty 1

## 2018-08-10 MED ORDER — CIPROFLOXACIN HCL 500 MG PO TABS: 500.0000 mg | ORAL_TABLET | Freq: Two times a day (BID) | ORAL | 0 refills | Status: AC

## 2018-08-10 MED ORDER — ONDANSETRON 4 MG PO TBDP: 4.0000 mg | ORAL_TABLET | Freq: Three times a day (TID) | ORAL | 0 refills | Status: DC | PRN

## 2018-08-10 MED ORDER — METRONIDAZOLE 500 MG PO TABS: 500.0000 mg | ORAL_TABLET | Freq: Two times a day (BID) | ORAL | 0 refills | Status: AC

## 2018-08-10 NOTE — ED Provider Notes (Signed)
Patient with left lower quadrant abdominal pain and history of diverticulitis.  CT abdomen and pelvis with multifocal acute diverticulitis in the sigmoid and descending colon.  No abscess or other complicating features.  I personally examined the patient and she is soft and nontender throughout the abdomen with a benign appearance.  Will be discharged on p.o. antibiotics.  To return for any worsening or concerning symptoms.   Orbie Pyo, MD 08/10/18 2232

## 2018-08-10 NOTE — ED Notes (Signed)
ED Provider at bedside. 

## 2018-08-10 NOTE — Discharge Instructions (Signed)
Please call and schedule follow-up appointment with Dr. Caryl Comes in about 2 weeks.  He may stop the nitrofurantoin, and take the ciprofloxacin and Flagyl as prescribed twice daily for 10 days.  You may also take the Zofran as needed for nausea.  I have sent in the tablets that dissolve under your tongue.  Do not take the Zofran that does not dissolve under the tongue if you are also taking the dissolvable tablets.  Return to the emergency department immediately fever, vomiting, increasing abdominal pain, or other symptoms of concern.

## 2018-08-10 NOTE — ED Provider Notes (Signed)
[facility] Emergency Department Provider Note  ____________________________________________   First MD Initiated Contact with Patient 08/10/18 1958     (approximate)  I have reviewed the triage vital signs and the nursing notes.   HISTORY  Chief Complaint Abdominal Pain   HPI Susan Chung is a 68 y.o. female who presents to the emergency department for treatment and evaluation of left lower quadrant pain with a history of recurrent bowel obstructions as well as diverticulitis.  5 days.  She is currently being treated for urinary tract infection.  She is had no vomiting or fever.  No relief with suppositories today.  Past Medical History:  Diagnosis Date  . Bowel obstruction ([place])   . 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 Cervical herniated disc   . Diabetes mellitus without complication ([place])   . Hypertension     Patient Active Problem List   Diagnosis Date Noted  . Partial small bowel obstruction ([place]) 09/05/2016    Past Surgical History:  Procedure Laterality Date  . LOOP RECORDER INSERTION N/A 11/25/2017   Procedure: LOOP RECORDER INSERTION;  Surgeon: Isaias Cowman, MD;  Location: [facility];  Service: Cardiovascular;  Laterality: N/A;    Prior to Admission medications   Medication Sig Start Date End Date Taking? Authorizing Provider  acetaminophen (TYLENOL) 650 MG CR tablet Take 1,300 mg by mouth every 8 (eight) hours as needed for pain.     [provider]  atorvastatin (LIPITOR) 20 MG tablet Take 20 mg by mouth daily. 11/09/17   [provider]  ciprofloxacin (CIPRO) 500 MG tablet Take 1 tablet (500 mg total) by mouth 2 (two) times daily for 10 days. 08/10/18 08/20/18  Beckhem Isadore, Johnette Abraham B, FNP  docusate sodium (COLACE) 100 MG capsule Take 100 mg by mouth daily as needed.     [provider]  levothyroxine (SYNTHROID, LEVOTHROID) 137 MCG tablet Take 137 mcg by mouth daily  before breakfast.  05/20/16   [provider]  lisinopril (PRINIVIL,ZESTRIL) 2.5 MG tablet Take 2.5 mg by mouth daily. 08/25/16   [provider]  meclizine (ANTIVERT) 25 MG tablet Take 12.5-25 mg by mouth 3 (three) times daily as needed for dizziness.  05/18/15   [provider]  metFORMIN (GLUCOPHAGE) 500 MG tablet Take 500 mg by mouth 2 (two) times daily. 01/22/16   [provider]  metroNIDAZOLE (FLAGYL) 500 MG tablet Take 1 tablet (500 mg total) by mouth 2 (two) times daily for 10 days. 08/10/18 08/20/18  Keshonda Monsour, Johnette Abraham B, FNP  omeprazole (PRILOSEC) 20 MG capsule Take 20 mg by mouth daily. 08/25/16   [provider]  ondansetron (ZOFRAN-ODT) 4 MG disintegrating tablet Take 1 tablet (4 mg total) by mouth every 8 (eight) hours as needed for nausea. 08/10/18   Batya Citron B, FNP  polyethylene glycol (MIRALAX / GLYCOLAX) packet Take 17 g by mouth daily. Patient taking differently: Take 17 g by mouth daily as needed.  09/08/16   Piscoya, Jacqulyn Bath, MD  traMADol (ULTRAM) 50 MG tablet Take 50 mg by mouth every 6 (six) hours as needed. for pain 07/27/16   [provider]  triamterene-hydrochlorothiazide (MAXZIDE-25) 37.5-25 MG tablet Take 1 tablet by mouth daily. 10/18/17   [provider]    Allergies Sulfa antibiotics  Family History  Problem Relation Age of Onset  . Breast cancer Neg Hx     Social History Social History   Tobacco Use  .  Smoking status: Never Smoker  . Smokeless tobacco: Never Used  Substance Use Topics  . Alcohol use: No  . Drug use: Never    Review of Systems  Constitutional: No fever/chills Eyes: No visual changes. ENT: No sore throat. Cardiovascular: Denies chest pain. Respiratory: Denies shortness of breath. Gastrointestinal: Positive for abdominal pain.  No nausea, no vomiting.  No diarrhea.  Positive for constipation. Genitourinary: Negative for dysuria. Musculoskeletal: Negative for back pain. Skin:  Negative for rash. Neurological: Negative for headaches, focal weakness or numbness.  ____________________________________________   PHYSICAL EXAM:  VITAL SIGNS: ED Triage Vitals [08/10/18 1942]  Enc Vitals Group     BP (!) 129/55     Pulse Rate 75     Resp 16     Temp 98.1 F (36.7 C)     Temp Source Oral     SpO2 100 %     Weight 210 lb (95.3 kg)     Height 5\' 3"  (1.6 m)     Head Circumference      Peak Flow      Pain Score 4     Pain Loc      Pain Edu?      Excl. in [place]?     Constitutional: Alert and oriented. Well appearing and in no acute distress. Eyes: Conjunctivae are normal.  Head: Atraumatic. Nose: No congestion/rhinnorhea. Mouth/Throat: Mucous membranes are moist.  Oropharynx non-erythematous. Neck: No stridor.   Cardiovascular: Normal rate, regular rhythm. Grossly normal heart sounds.  Good peripheral circulation. Respiratory: Normal respiratory effort.  No retractions. Lungs CTAB. Gastrointestinal: Soft and diffusely tender.  Diffuse mild distention. No abdominal bruits.  Bowel sounds present x4 quadrants  Musculoskeletal: No lower extremity tenderness nor edema.  No joint effusions. Neurologic:  Normal speech and language. No gross focal neurologic deficits are appreciated. No gait instability. Skin:  Skin is warm, dry and intact.  Scattered hives noted across the abdominal wall Psychiatric: Mood and affect are normal. Speech and behavior are normal.  ____________________________________________   LABS (all labs ordered are listed, but only abnormal results are displayed)  Labs Reviewed  COMPREHENSIVE METABOLIC PANEL - Abnormal; Notable for the following components:      Result Value   Glucose, Bld 153 (*)    All other components within normal limits  URINALYSIS, COMPLETE (UACMP) WITH MICROSCOPIC - Abnormal; Notable for the following components:   Color, Urine STRAW (*)    APPearance CLEAR (*)    All other components within normal limits  LIPASE,  BLOOD  CBC   ____________________________________________  EKG  Not indicated ____________________________________________  RADIOLOGY  ED MD interpretation: CT of the abdomen and pelvis shows a multifocal acute diverticulitis in the sigmoid and descending colon with some bowel wall thickening that continues to the proximal rectum.  There is no evidence of abscess or rupture.  No bowel obstruction noted.  Official radiology report(s): Ct Abdomen Pelvis W Contrast  Result Date: 08/10/2018 CLINICAL DATA:  68 year old female with abdominal pain. On antibiotics for UTI. History of prior abdominal surgery and small bowel obstruction EXAM: CT ABDOMEN AND PELVIS WITH CONTRAST TECHNIQUE: Multidetector CT imaging of the abdomen and pelvis was performed using the standard protocol following bolus administration of intravenous contrast. CONTRAST:  120mL ISOVUE-300 IOPAMIDOL (ISOVUE-300) INJECTION 61% COMPARISON:  CT Abdomen and Pelvis 09/05/2016 and earlier. FINDINGS: Lower chest: Negative; minor atelectasis. No pericardial or pleural effusion. Hepatobiliary: Chronic a patent steatosis and hepatomegaly. Surgically absent gallbladder. Pancreas: Negative. Spleen: Negative. Adrenals/Urinary Tract: Adrenal  glands are stable and within normal limits. Chronic left renal cortical scarring. Stable left kidney. The right kidney remains normal. Symmetric renal contrast excretion. Inflammation adjacent to the urinary bladder dome which remains within normal limits. Stomach/Bowel: Diverticulosis of the sigmoid colon with moderate mesenteric inflammatory stranding and intermittent underlying sigmoid bowel wall thickening. Retained stool in the sigmoid. The wall thickening continues into the proximal rectum as seen on series 2, image 71. The diverticulosis continues throughout the descending colon and there is also a short segment of inflammation in the left gutter adjacent to a left colon diverticula on series 2, image 41.  No extraluminal gas identified. Trace free fluid but no fluid collection. Redundant transverse colon and flexures. Diverticulosis at the hepatic flexure and in some of the right colon. No additional active inflammation identified. Normal appendix. Negative terminal ileum. Previous ventral abdominal hernia repair with mesh. No dilated small bowel. Postoperative changes to the stomach negative duodenum. Vascular/Lymphatic: Calcified iliac artery atherosclerosis. Major arterial structures are patent. Patent main portal vein. No lymphadenopathy. Reproductive: Diminutive or absent. Other: Trace pelvic free fluid (series 2, image 76). Musculoskeletal: No acute osseous abnormality identified. IMPRESSION: 1. Multifocal Acute Diverticulitis in the sigmoid and the descending colon. Bowel wall thickening continues to the proximal rectum. Trace free fluid but no abscess or other complicating features. 2. Negative for bowel obstruction. 3. Chronic fatty liver disease and hepatomegaly. Chronic left renal cortical scarring. Electronically Signed   By: Genevie Ann M.D.   On: 08/10/2018 21:39    ____________________________________________   PROCEDURES  Procedure(s) performed: None  Procedures  Critical Care performed: No  ____________________________________________   INITIAL IMPRESSION / ASSESSMENT AND PLAN / ED COURSE  As part of my medical decision making, I reviewed the following data within the [facility] chart reviewed, Notes from prior ED visits and [place] Controlled Substance Database   68 year old female presenting to the emergency department for treatment and evaluation of abdominal pain with constipation and setting of frequent diverticulitis and bowel obstructions.  Exam is reassuring as she has bowel sounds in all 4 quadrants.  CT of the abdomen and pelvis with contrast will be obtained.  As expected, there is no bowel obstruction today, however there is diverticulitis.  She will  be treated with ciprofloxacin and Flagyl for 10 days.  She is encouraged to call and schedule follow-up appointment with her primary care provider.  She was also encouraged to return immediately for acute increase in pain, vomiting, fever, or other symptoms of concern if she is unable to see her primary care provider.  Patient and daughter were reassured by the CT findings and agree to the follow-up plan.    ____________________________________________   FINAL CLINICAL IMPRESSION(S) / ED DIAGNOSES  Final diagnoses:  Diverticulitis of large intestine without perforation or abscess without bleeding     ED Discharge Orders         Ordered    ciprofloxacin (CIPRO) 500 MG tablet  2 times daily     08/10/18 2208    metroNIDAZOLE (FLAGYL) 500 MG tablet  2 times daily     08/10/18 2208    ondansetron (ZOFRAN-ODT) 4 MG disintegrating tablet  Every 8 hours PRN     08/10/18 2208           Note:  This document was prepared using Dragon voice recognition software and may include unintentional dictation errors.    Victorino Dike, FNP 08/11/18 2878    Schaevitz, Randall An, MD  08/11/18 2252  

## 2018-08-10 NOTE — ED Triage Notes (Signed)
Pt to the er for possible bowel obstruction. Pt has a hx of same with several surgical interventions. Pt has not had a normal BM since Wednesday. Pt reports UTI symptoms as well. Pt currently taking antibiotics for UTI. Pt was given sulfa drug first but is now on nitrofurantin. Pt took suppositories today. Pt also has a hx of Hemorid.  No blood in stool.

## 2018-09-16 ENCOUNTER — Other Ambulatory Visit: Payer: Self-pay | Admitting: Internal Medicine

## 2018-09-16 DIAGNOSIS — Z1231 Encounter for screening mammogram for malignant neoplasm of breast: Secondary | ICD-10-CM

## 2018-09-28 ENCOUNTER — Ambulatory Visit
Admission: RE | Admit: 2018-09-28 | Discharge: 2018-09-28 | Disposition: A | Discharge status: Home / Self Care | Payer: Medicare HMO | Source: Ambulatory Visit | Attending: Internal Medicine | Admitting: Internal Medicine

## 2018-09-28 DIAGNOSIS — Z1231 Encounter for screening mammogram for malignant neoplasm of breast: Secondary | ICD-10-CM

## 2019-06-16 ENCOUNTER — Encounter: Payer: Self-pay | Admitting: Medical Oncology

## 2019-06-16 ENCOUNTER — Other Ambulatory Visit: Payer: Self-pay

## 2019-06-16 ENCOUNTER — Emergency Department: Payer: Medicare HMO

## 2019-06-16 ENCOUNTER — Observation Stay
Admission: EM | Admit: 2019-06-16 | Discharge: 2019-06-17 | Disposition: A | Discharge status: Home / Self Care | Payer: Medicare HMO | Attending: Internal Medicine | Admitting: Internal Medicine

## 2019-06-16 DIAGNOSIS — Z7984 Long term (current) use of oral hypoglycemic drugs: Secondary | ICD-10-CM | POA: Diagnosis not present

## 2019-06-16 DIAGNOSIS — R42 Dizziness and giddiness: Secondary | ICD-10-CM | POA: Diagnosis present

## 2019-06-16 DIAGNOSIS — E119 Type 2 diabetes mellitus without complications: Secondary | ICD-10-CM | POA: Diagnosis not present

## 2019-06-16 DIAGNOSIS — Z882 Allergy status to sulfonamides status: Secondary | ICD-10-CM | POA: Insufficient documentation

## 2019-06-16 DIAGNOSIS — K219 Gastro-esophageal reflux disease without esophagitis: Secondary | ICD-10-CM | POA: Insufficient documentation

## 2019-06-16 DIAGNOSIS — E785 Hyperlipidemia, unspecified: Secondary | ICD-10-CM | POA: Diagnosis present

## 2019-06-16 DIAGNOSIS — E039 Hypothyroidism, unspecified: Secondary | ICD-10-CM | POA: Diagnosis not present

## 2019-06-16 DIAGNOSIS — Z79899 Other long term (current) drug therapy: Secondary | ICD-10-CM | POA: Diagnosis not present

## 2019-06-16 DIAGNOSIS — Z20828 Contact with and (suspected) exposure to other viral communicable diseases: Secondary | ICD-10-CM | POA: Diagnosis not present

## 2019-06-16 DIAGNOSIS — I10 Essential (primary) hypertension: Secondary | ICD-10-CM | POA: Insufficient documentation

## 2019-06-16 DIAGNOSIS — Z79890 Hormone replacement therapy (postmenopausal): Secondary | ICD-10-CM | POA: Insufficient documentation

## 2019-06-16 DIAGNOSIS — I639 Cerebral infarction, unspecified: Secondary | ICD-10-CM | POA: Diagnosis present

## 2019-06-16 DIAGNOSIS — Z8673 Personal history of transient ischemic attack (TIA), and cerebral infarction without residual deficits: Secondary | ICD-10-CM | POA: Diagnosis present

## 2019-06-16 DIAGNOSIS — I69398 Other sequelae of cerebral infarction: Secondary | ICD-10-CM | POA: Diagnosis not present

## 2019-06-16 LAB — CBC
HCT: 40.9 % (ref 36.0–46.0)
Hemoglobin: 13.8 g/dL (ref 12.0–15.0)
MCH: 29.5 pg (ref 26.0–34.0)
MCHC: 33.7 g/dL (ref 30.0–36.0)
MCV: 87.4 fL (ref 80.0–100.0)
Platelets: 185 10*3/uL (ref 150–400)
RBC: 4.68 MIL/uL (ref 3.87–5.11)
RDW: 12.7 % (ref 11.5–15.5)
WBC: 8.7 10*3/uL (ref 4.0–10.5)
nRBC: 0 % (ref 0.0–0.2)

## 2019-06-16 LAB — URINALYSIS, COMPLETE (UACMP) WITH MICROSCOPIC
Bacteria, UA: NONE SEEN
Bilirubin Urine: NEGATIVE
Glucose, UA: NEGATIVE mg/dL
Hgb urine dipstick: NEGATIVE
Ketones, ur: 20 mg/dL — AB
Leukocytes,Ua: NEGATIVE
Nitrite: NEGATIVE
Protein, ur: NEGATIVE mg/dL
Specific Gravity, Urine: 1.016 (ref 1.005–1.030)
Squamous Epithelial / HPF: NONE SEEN (ref 0–5)
pH: 7 (ref 5.0–8.0)

## 2019-06-16 LAB — COMPREHENSIVE METABOLIC PANEL
ALT: 31 U/L (ref 0–44)
AST: 37 U/L (ref 15–41)
Albumin: 4.1 g/dL (ref 3.5–5.0)
Alkaline Phosphatase: 77 U/L (ref 38–126)
Anion gap: 15 (ref 5–15)
BUN: 17 mg/dL (ref 8–23)
CO2: 24 mmol/L (ref 22–32)
Calcium: 9.5 mg/dL (ref 8.9–10.3)
Chloride: 98 mmol/L (ref 98–111)
Creatinine, Ser: 0.75 mg/dL (ref 0.44–1.00)
GFR calc Af Amer: >60 mL/min (ref >60)
GFR calc non Af Amer: >60 mL/min (ref >60)
Glucose, Bld: 196 mg/dL — ABNORMAL HIGH (ref 70–99)
Potassium: 3.9 mmol/L (ref 3.5–5.1)
Sodium: 137 mmol/L (ref 135–145)
Total Bilirubin: 0.6 mg/dL (ref 0.3–1.2)
Total Protein: 7.6 g/dL (ref 6.5–8.1)

## 2019-06-16 LAB — TROPONIN I (HIGH SENSITIVITY): Troponin I (High Sensitivity): 3 ng/L (ref <18)

## 2019-06-16 LAB — LIPASE, BLOOD: Lipase: 17 U/L (ref 11–51)

## 2019-06-16 MED ORDER — SODIUM CHLORIDE 0.9 % IV BOLUS
500.0000 mL | Freq: Once | INTRAVENOUS | Status: AC
  Administered 2019-06-16: 500 mL via INTRAVENOUS

## 2019-06-16 MED ORDER — ONDANSETRON HCL 4 MG/2ML IJ SOLN
4.0000 mg | Freq: Once | INTRAMUSCULAR | Status: AC
  Administered 2019-06-16: 18:00:00 4 mg via INTRAVENOUS
  Filled 2019-06-16: qty 2

## 2019-06-16 MED ORDER — MECLIZINE HCL 25 MG PO TABS
25.0000 mg | ORAL_TABLET | Freq: Once | ORAL | Status: AC
  Administered 2019-06-16: 14:00:00 25 mg via ORAL
  Filled 2019-06-16: qty 1

## 2019-06-16 MED ORDER — METOCLOPRAMIDE HCL 5 MG/ML IJ SOLN
10.0000 mg | Freq: Once | INTRAMUSCULAR | Status: AC
  Administered 2019-06-16: 14:00:00 10 mg via INTRAVENOUS
  Filled 2019-06-16: qty 2

## 2019-06-16 MED ORDER — METHYLPREDNISOLONE SODIUM SUCC 125 MG IJ SOLR
125.0000 mg | Freq: Once | INTRAMUSCULAR | Status: AC
  Administered 2019-06-16: 18:00:00 125 mg via INTRAVENOUS
  Filled 2019-06-16: qty 2

## 2019-06-16 MED ORDER — SODIUM CHLORIDE 0.9 % IV BOLUS
500.0000 mL | Freq: Once | INTRAVENOUS | Status: AC
  Administered 2019-06-16: 18:00:00 500 mL via INTRAVENOUS

## 2019-06-16 MED ORDER — DIPHENHYDRAMINE HCL 50 MG/ML IJ SOLN
25.0000 mg | Freq: Once | INTRAMUSCULAR | Status: AC
  Administered 2019-06-16: 14:00:00 25 mg via INTRAVENOUS
  Filled 2019-06-16: qty 1

## 2019-06-16 MED ORDER — MECLIZINE HCL 25 MG PO TABS
25.0000 mg | ORAL_TABLET | Freq: Once | ORAL | Status: AC
  Administered 2019-06-16: 18:00:00 25 mg via ORAL
  Filled 2019-06-16: qty 1

## 2019-06-16 MED ORDER — LORAZEPAM 2 MG/ML IJ SOLN
1.0000 mg | Freq: Once | INTRAMUSCULAR | Status: AC
  Administered 2019-06-16: 18:00:00 1 mg via INTRAVENOUS
  Filled 2019-06-16: qty 1

## 2019-06-16 MED ORDER — SODIUM CHLORIDE 0.9% FLUSH: 3.0000 mL | Freq: Once | INTRAVENOUS | Status: DC

## 2019-06-16 NOTE — ED Triage Notes (Signed)
Pt in via EMS from home. EMS reports called out for stroke but stroke screen was negative. EMS reports pt dizzy and weak since Monday. EMS administered 4mg  of zofran. Pt with hx of vertigo and meneres.  FSBS 210, 162/73, HR71. 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

## 2019-06-16 NOTE — ED Provider Notes (Signed)
[facility] Emergency Department Provider Note ____________________________________________   First MD Initiated Contact with Patient 06/16/19 1337     (approximate)  I have reviewed the triage vital signs and the nursing notes.   HISTORY  Chief Complaint Dizziness and Emesis    HPI Susan Chung is a 68 y.o. female with PMH as noted below but no prior stroke history who presents with dizziness over the last several days, improved at this morning, but then worsened a few hours ago.  She describes it as lightheadedness, but with intermittent periods of a more vertigo-like or spinning sensation.  The patient has had vertigo in the past.  She reports associated nausea but no vomiting.  She has no headache, vision changes, speech disturbance, or any chest or abdominal pain.  She denies any weakness or numbness to the extremities.  Zofran was given by EMS with no improvement.   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 Authorizing Provider  acetaminophen (TYLENOL) 650 MG CR tablet Take 1,300 mg by mouth every 8 (eight) hours as needed for pain.    Yes [provider]  atorvastatin (LIPITOR) 20 MG tablet Take 20 mg by mouth daily. 11/09/17  Yes [provider]  docusate sodium (COLACE) 100 MG capsule Take 100 mg by mouth daily as  needed for mild constipation.    Yes [provider]  levothyroxine (SYNTHROID) 150 MCG tablet Take 150 mcg by mouth daily before breakfast.    Yes [provider]  lisinopril (PRINIVIL,ZESTRIL) 2.5 MG tablet Take 2.5 mg by mouth daily. 08/25/16  Yes [provider]  meclizine (ANTIVERT) 25 MG tablet Take 12.5-25 mg by mouth 3 (three) times daily as needed for dizziness.  05/18/15  Yes [provider]  metFORMIN (GLUCOPHAGE) 500 MG tablet Take 500 mg by mouth 2 (two) times daily. 01/22/16  Yes [provider]  polyethylene glycol (MIRALAX / GLYCOLAX) packet Take 17 g by mouth daily. Patient taking differently: Take 17 g by mouth daily as needed.  09/08/16  Yes Piscoya, Jacqulyn Bath, MD  traMADol (ULTRAM) 50 MG tablet Take 50 mg by mouth every 6 (six) hours as needed. for pain 07/27/16  Yes [provider]  traZODone (DESYREL) 100 MG tablet Take 100 mg by mouth at bedtime. 04/27/19  Yes [provider]  triamterene-hydrochlorothiazide (MAXZIDE-25) 37.5-25 MG tablet Take 1 tablet by mouth daily. 10/18/17  Yes [provider]  omeprazole (PRILOSEC) 20 MG capsule Take 20 mg by mouth daily. 08/25/16   [provider]  ondansetron (ZOFRAN-ODT) 4 MG disintegrating tablet Take 1 tablet (4 mg total) by mouth every 8 (eight) hours as needed for nausea. Patient not taking: Reported on 06/16/2019 08/10/18   Sherrie George B, FNP    Allergies Sulfa antibiotics  Family History  Problem Relation Age of Onset  Breast cancer Neg Hx     Social History Social History   Tobacco Use   Smoking status: Never Smoker   Smokeless tobacco: Never Used  Substance Use Topics   Alcohol use: No   Drug use: Never    Review of Systems  Constitutional: No fever/chills. Eyes: No visual changes. ENT: No sore throat. Cardiovascular: Denies chest pain. Respiratory: Denies shortness of breath. Gastrointestinal: Positive for  nausea. Genitourinary: Negative for dysuria.  Musculoskeletal: Negative for back pain. Skin: Negative for rash. Neurological: Negative for focal weakness or numbness.   ____________________________________________   PHYSICAL EXAM:  VITAL SIGNS: ED Triage Vitals  Enc Vitals Group     BP 06/16/19 1033 (!) 152/61     Pulse Rate 06/16/19 1033 63     Resp 06/16/19 1033 20     Temp 06/16/19 1033 97.6 F (36.4 C)     Temp Source 06/16/19 1033 Oral     SpO2 06/16/19 1033 97 %     Weight 06/16/19 1034 220 lb (99.8 kg)     Height 06/16/19 1034 5\' 4"  (1.626 m)     Head Circumference --      Peak Flow --      Pain Score 06/16/19 1034 0     Pain Loc --      Pain Edu? --      Excl. in [place]? --     Constitutional: Alert and oriented.  Uncomfortable appearing but in no acute distress. Eyes: Conjunctivae are normal.  EOMI.  PERRLA. Head: Atraumatic. Nose: No congestion/rhinnorhea. Mouth/Throat: Mucous membranes are moist.   Neck: Normal range of motion.  Cardiovascular: Normal rate, regular rhythm. Good peripheral circulation. Respiratory: Normal respiratory effort.  No retractions. Gastrointestinal: No distention.  Musculoskeletal: Extremities warm and well perfused.  Neurologic:  Normal speech and language.  No facial droop.  Motor and sensory intact in all extremities.  No pronator drift.  Mild ataxia bilaterally on finger-to-nose.   Skin:  Skin is warm and dry. No rash noted. Psychiatric: Mood and affect are normal. Speech and behavior are normal.  ____________________________________________   LABS (all labs ordered are listed, but only abnormal results are displayed)  Labs Reviewed  COMPREHENSIVE METABOLIC PANEL - Abnormal; Notable for the following components:      Result Value   Glucose, Bld 196 (*)    All other components within normal limits  URINALYSIS, COMPLETE (UACMP) WITH MICROSCOPIC - Abnormal; Notable for the following components:   Color, Urine YELLOW (*)     APPearance HAZY (*)    Ketones, ur 20 (*)    All other components within normal limits  LIPASE, BLOOD  CBC  TROPONIN I (HIGH SENSITIVITY)   ____________________________________________  EKG  ED ECG REPORT I, Arta Silence, the attending physician, personally viewed and interpreted this ECG.  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  68 year old female with PMH as noted above including a history of vertigo presents with intermittent dizziness over the last several days, now more constant in the last few hours.  She describes both lightheadedness and intermittent spinning.  She has nausea but no vomiting.  She denies any focal neurologic symptoms.  I reviewed the past medical records in [place].  The patient was most recently seen in the ED at the beginning the year for diverticulitis.  She was last admitted 2018 for small bowel obstruction.  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  On exam today, the patient is uncomfortable appearing.  Her vital signs are normal.   Neurologic exam is nonfocal, except that she has very mild ataxia bilaterally on finger-to-nose.  Otherwise she demonstrates normal coordination.  Overall presentation is most consistent with vertigo.  Given the intermittent nature of the symptoms and the relatively acute onset, as well as the prior history of vertigo, I suspect a peripheral etiology although it is possible that there could be some acute on chronic involvement of the cerebellum.  I have a low suspicion for acute stroke given the lack of other focal neurologic findings.  Differential also includes other causes of generalized weakness or dizziness including dehydration, electrolyte abnormality, other metabolic cause, or ACS.  We will obtain a CT head, lab work-up, give fluids, symptomatic treatment, and reassess.  ----------------------------------------- 3:29 PM on 06/16/2019 -----------------------------------------  The patient reports improved dizziness.  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  The patient and her daughter report that the patient's dizziness has escalated over the last few months.  Based on shared decision making, we will obtain another MRI today to further evaluate for more subtle acute changes.  If this is negative, disposition will be based on how well the patient's symptoms can be controlled.  I have signed the patient out to the oncoming physician Dr. Joni Fears. ____________________________________________   FINAL CLINICAL IMPRESSION(S) / ED DIAGNOSES  Final diagnoses:  Dizziness      NEW MEDICATIONS STARTED DURING THIS VISIT:  New Prescriptions   No medications on file     Note:  This document was prepared using Dragon voice recognition software and may include unintentional dictation errors.    Arta Silence, MD 06/16/19 1530

## 2019-06-16 NOTE — ED Notes (Signed)
Patient transported to MRI 

## 2019-06-16 NOTE — ED Provider Notes (Signed)
Procedures  Clinical Course as of Jun 15 2206  Wed Jun 16, 2019  1801 MRI unremarkable.  Patient reassessed and still feels very dizzy.  She wishes to go home but would like her symptoms to be better controlled first.  She does not want to be admitted to the hospital currently.  I will give her additional IV fluids, meclizine, Ativan, Solu-Medrol and reassess.   [PS]    Clinical Course User Index [PS] Carrie Mew, MD    ----------------------------------------- 10:07 PM on 06/16/2019 -----------------------------------------  Still no improvement, vital signs remained stable.  Patient unable to stand up or tolerate oral intake.  Will need to hospitalize until symptomatically improving.  Patient agrees and is reluctant to go home in her current condition.  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

## 2019-06-16 NOTE — ED Notes (Signed)
Pure wick back in place. Pt asking for her  "dizzy pill." Alicia,RN made aware.

## 2019-06-17 DIAGNOSIS — K760 Fatty (change of) liver, not elsewhere classified: Secondary | ICD-10-CM | POA: Insufficient documentation

## 2019-06-17 DIAGNOSIS — E119 Type 2 diabetes mellitus without complications: Secondary | ICD-10-CM

## 2019-06-17 DIAGNOSIS — E785 Hyperlipidemia, unspecified: Secondary | ICD-10-CM | POA: Diagnosis present

## 2019-06-17 DIAGNOSIS — L301 Dyshidrosis [pompholyx]: Secondary | ICD-10-CM | POA: Insufficient documentation

## 2019-06-17 DIAGNOSIS — I69398 Other sequelae of cerebral infarction: Secondary | ICD-10-CM | POA: Diagnosis not present

## 2019-06-17 DIAGNOSIS — E039 Hypothyroidism, unspecified: Secondary | ICD-10-CM | POA: Diagnosis present

## 2019-06-17 DIAGNOSIS — K5790 Diverticulosis of intestine, part unspecified, without perforation or abscess without bleeding: Secondary | ICD-10-CM | POA: Insufficient documentation

## 2019-06-17 DIAGNOSIS — I639 Cerebral infarction, unspecified: Secondary | ICD-10-CM | POA: Diagnosis present

## 2019-06-17 DIAGNOSIS — Z8673 Personal history of transient ischemic attack (TIA), and cerebral infarction without residual deficits: Secondary | ICD-10-CM | POA: Diagnosis present

## 2019-06-17 DIAGNOSIS — I10 Essential (primary) hypertension: Secondary | ICD-10-CM | POA: Diagnosis not present

## 2019-06-17 DIAGNOSIS — E538 Deficiency of other specified B group vitamins: Secondary | ICD-10-CM | POA: Insufficient documentation

## 2019-06-17 DIAGNOSIS — R42 Dizziness and giddiness: Secondary | ICD-10-CM

## 2019-06-17 LAB — HIV ANTIBODY (ROUTINE TESTING W REFLEX): HIV Screen 4th Generation wRfx: NONREACTIVE

## 2019-06-17 LAB — SARS CORONAVIRUS 2 (TAT 6-24 HRS): SARS Coronavirus 2: NEGATIVE

## 2019-06-17 MED ORDER — METFORMIN HCL 500 MG PO TABS
500.0000 mg | ORAL_TABLET | Freq: Two times a day (BID) | ORAL | Status: DC
  Administered 2019-06-17: 500 mg via ORAL
  Filled 2019-06-17 (×3): qty 1

## 2019-06-17 MED ORDER — ATORVASTATIN CALCIUM 20 MG PO TABS
20.0000 mg | ORAL_TABLET | Freq: Every day | ORAL | Status: DC
  Administered 2019-06-17: 03:00:00 20 mg via ORAL
  Filled 2019-06-17 (×2): qty 1

## 2019-06-17 MED ORDER — PANTOPRAZOLE SODIUM 40 MG PO TBEC
40.0000 mg | DELAYED_RELEASE_TABLET | Freq: Every day | ORAL | Status: DC
  Administered 2019-06-17: 10:00:00 40 mg via ORAL
  Filled 2019-06-17: qty 1

## 2019-06-17 MED ORDER — SODIUM CHLORIDE 0.9 % IV SOLN: 250.0000 mL | INTRAVENOUS | Status: DC | PRN

## 2019-06-17 MED ORDER — TRAZODONE HCL 100 MG PO TABS
100.0000 mg | ORAL_TABLET | Freq: Every day | ORAL | Status: DC
  Administered 2019-06-17: 100 mg via ORAL
  Filled 2019-06-17 (×2): qty 1

## 2019-06-17 MED ORDER — TRAZODONE HCL 50 MG PO TABS: 25.0000 mg | ORAL_TABLET | Freq: Every evening | ORAL | Status: DC | PRN

## 2019-06-17 MED ORDER — ENOXAPARIN SODIUM 40 MG/0.4ML ~~LOC~~ SOLN
40.0000 mg | SUBCUTANEOUS | Status: DC
  Administered 2019-06-17: 40 mg via SUBCUTANEOUS
  Filled 2019-06-17: qty 0.4

## 2019-06-17 MED ORDER — POLYETHYLENE GLYCOL 3350 17 G PO PACK
17.0000 g | PACK | Freq: Every day | ORAL | Status: DC | PRN
  Filled 2019-06-17: qty 1

## 2019-06-17 MED ORDER — DOCUSATE SODIUM 100 MG PO CAPS: 100.0000 mg | ORAL_CAPSULE | Freq: Every day | ORAL | Status: DC | PRN

## 2019-06-17 MED ORDER — TRIAMTERENE-HCTZ 37.5-25 MG PO TABS
1.0000 | ORAL_TABLET | Freq: Every day | ORAL | Status: DC
  Administered 2019-06-17: 1 via ORAL
  Filled 2019-06-17: qty 1

## 2019-06-17 MED ORDER — LISINOPRIL 2.5 MG PO TABS
2.5000 mg | ORAL_TABLET | Freq: Every day | ORAL | Status: DC
  Administered 2019-06-17: 2.5 mg via ORAL
  Filled 2019-06-17: qty 1

## 2019-06-17 MED ORDER — TRAMADOL HCL 50 MG PO TABS: 50.0000 mg | ORAL_TABLET | Freq: Four times a day (QID) | ORAL | Status: DC | PRN

## 2019-06-17 MED ORDER — MECLIZINE HCL 25 MG PO TABS
12.5000 mg | ORAL_TABLET | Freq: Three times a day (TID) | ORAL | Status: DC | PRN
  Filled 2019-06-17: qty 1

## 2019-06-17 MED ORDER — SODIUM CHLORIDE 0.9% FLUSH: 3.0000 mL | Freq: Two times a day (BID) | INTRAVENOUS | Status: DC

## 2019-06-17 MED ORDER — ONDANSETRON 4 MG PO TBDP: 4.0000 mg | ORAL_TABLET | Freq: Three times a day (TID) | ORAL | Status: DC | PRN

## 2019-06-17 MED ORDER — LEVOTHYROXINE SODIUM 150 MCG PO TABS
150.0000 ug | ORAL_TABLET | Freq: Every day | ORAL | Status: DC
  Administered 2019-06-17: 10:00:00 150 ug via ORAL
  Filled 2019-06-17: qty 1
  Filled 2019-06-17: qty 3

## 2019-06-17 MED ORDER — SODIUM CHLORIDE 0.9% FLUSH: 3.0000 mL | INTRAVENOUS | Status: DC | PRN

## 2019-06-17 NOTE — ED Notes (Signed)
Hospitalist at bedside 

## 2019-06-17 NOTE — Discharge Instructions (Signed)
You were cared for by a hospitalist during your hospital stay. If you have any questions about your discharge medications or the care you received while you were in the hospital after you are discharged, you can call the unit and ask to speak with the hospitalist on call if the hospitalist that took care of you is not available. Once you are discharged, your primary care physician will handle any further medical issues. Please note that NO REFILLS for any discharge medications will be authorized once you are discharged, as it is imperative that you return to your primary care physician (or establish a relationship with a primary care physician if you do not have one) for your aftercare needs so that they can reassess your need for medications and monitor your lab values.  Follow up with PCP in 1 week and  ENT doctor as soon as possible. Take all medications as prescribed. If symptoms change or worsen please return to the ED for evaluation

## 2019-06-17 NOTE — ED Notes (Signed)
Pt O2 sat at 91% while sleeping. Placed pt on 2L via nasal cannula.

## 2019-06-17 NOTE — Progress Notes (Signed)
Physical Therapy Evaluation Patient Details Name: Susan Chung MRN: [id number] DOB: [birth year] Today's Date: 06/17/2019   History of Present Illness  Susan Chung is a 68 y.o. female with medical history significant of dizziness dating back to 2008 when she was diagnosed with Mnire's disease.  Patient had recurrent vertigo.  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  She had recurrent dizziness and in February 2019 she had a second MRI which showed a new small cerebellar infarct.  Patient is also followed for diabetes, hypertension and cervical herniated disc.  She reports that she has had recurrent dizziness on and off for several weeks.  She reports an episode that lasted a full day.  She presented yesterday because of persistent dizziness/vertigo accompanied by nausea and weakness.  She describes both a sense of disequilibrium and imbalance as well as a room spinning type of vertigo.  She denies any severe headache, cardiac palpitations, or focal neurologic symptoms such as weakness or loss of sensation. No previous history of migraines although she does describe some flashes of light in her vision when symptoms were bad on Sunday.  She has been able to ambulate since the symptoms started. She has not been seen by neurology for some time but does follow with her primary care physician on a regular basis. Symptoms have occurred daily and typically last for minutes to hours. They occur both with movement and spontaneously. No specific aggravating factors identified. She also complains of R aural fullness as well as R ear tinnitus and difficulty hearing from her R ear. She is not complaining of tinnitus at time of PT evaluation. She is not complaining of any visual disturbance or headaches. She is complaining of some R facial tingling which has been going on for months since the spring when she had a case of shingles around her R eye, R face, and R scalp. She states that the  tingling has worsened recently. She also complains of chronic leg cramps but no recent worsening. She tried Flonase to help her symptoms without any benefit. She has also used meclizine but denies any improvement. The states that the only thing that helps is if she remains still and focuses on a point in front of her. History is negative for any red flag items (dysarthria, dysphagia, drop attacks, bowel and bladder changes, recent weight loss/gain).  Clinical Impression  Pt admitted with above diagnosis. Pt currently with functional limitations due to the deficits listed below (see PT Problem List).   Pt demonstrates some mild imbalance with sit to stand transfers initially. She supports her knees on the bed and doesn't require external assist but is obviously unsteady. She is able to ambulate in hallway initially without assistive device but eventually added a rolling walker for safety. She is initially somewhat unsteady without walker but once walker is added she demonstrates good balance and safety awareness. Gait speed is slow but functional for full household mobility. Decreased spontaneous head turns noted during gait. Her bedside vestibular exam is negative for any significant neurological findings with the exception of tingling reported on the R side of face and diminished hearing in the R ear which she reports is new. In room light she demonstrates a R pure horizontal beating nystagmus at mid-range to the R but absent at center and L gaze. She also has a positive L head thrust. Smooth pursuits are somewhat saccadic and horizontal saccade testing is slow and hypometric. Both of these findings are  possibly related to her previous cerebellar infarct. 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 She also has an increase in the intensity of her horizontal nystagmus post-head shake. 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 Differential includes an acute vestibular neuritis or labyrinthitis (pt reports R sided hearing loss). She could have a acute R sided vestibular weakness s/p an acute Menire's attack given her previous history of Menire's disease. MRI is negative for acute changes however pt does reports some tingling in the R side of her face which should be monitored. However it sounds like this is more of a chronic issue since her episodes of shingles in the spring. She does not present with any R sided facial weakness and does not appear to have any rashes or eruptions today around the R side of her face or around/in her R ear based on what can be observed externally. Given her complaints of R hearing loss she should be monitored if any concerns for Ramsay Hunt Syndrome arise. Overall she is safe to return home with the rolling walker. Pt and daughter are in agreement with this plan depending on the decision of the hospitalist. Pt should follow-up with ENT on an outpatient basis as soon as feasible as she might need a steroid taper if she is determined to have any acute hearing loss. Once evaluated by ENT would likely benefit by referral to OP vestibular therapy for continued management. Pt will benefit from PT services to address deficits in strength, balance, and mobility in order to return to full function at home.            Follow Up Recommendations Outpatient PT;Other (comment);Supervision for mobility/OOB(Vestibular therapy)    Equipment Recommendations  Other (comment)(Pt advised to use her rolling walker at home)    Recommendations for Other Services       Precautions / Restrictions Precautions Precautions: Fall Restrictions Weight Bearing Restrictions: No      Mobility  NEUROLOGICAL SCREEN: (2+ unless otherwise noted.) N=normal  Ab=abnormal  Level Dermatome R L Myotome R L Reflex R L  C3 Anterior Neck N N Sidebend C2-3   Jaw CN V    C4 Top of Shoulder N N Shoulder Shrug C4    Hoffman's UMN    C5 Lateral Upper Arm N N Shoulder ABD C4-5 N N Biceps C5-6    C6 Lateral Arm/ Thumb N N Arm Flex/ Wrist Ext C5-6 N N Brachiorad. C5-6    C7 Middle Finger N N Arm Ext//Wrist Flex C6-7 N N Triceps C7    C8 4th & 5th Finger N N Flex/ Ext Carpi Ulnaris C8 N N Patellar (L3-4)    T1 Medial Arm N N Interossei T1 N N Gastrocnemius    L2 Medial thigh/groin N N Illiopsoas (L2-3) N N     L3 Lower thigh/med.knee N N Quadriceps (L3-4) N N     L4 Medial leg/lat thigh N N Tibialis Ant (L4-5) N N     L5 Lat. leg & dorsal foot N N EHL (L5) N N     S1 post/lat foot/thigh/leg N N Gastrocnemius (S1-2) N N     S2 Post./med. thigh & leg N N Hamstrings (L4-S3) N N      Reflex testing deferred on this date  Cranial Nerves Visual acuity and visual fields are intact  Extraocular muscles are intact  Facial sensation is intact bilaterally  Facial strength  is intact bilaterally  Hearing is diminished on the R side as tested by finger rub Tongue protrudes midline   COORDINATION: Finger to Nose: Normal Rapid Alternating Movements: Normal Finger to Thumb Opposition: Normal    OCULOMOTOR / VESTIBULAR TESTING:  Oculomotor Exam- Room Light  Findings Comments  Ocular Alignment normal   Ocular ROM normal   Spontaneous Nystagmus normal   Gaze-Holding Nystagmus abnormal Pure horizontal beating nystagmus at mid-range R gaze  End-Gaze Nystagmus normal Normal to the L, mid and end range R horizontal nystagmus with R gaze  Vergence (normal 2-3") normal   Smooth Pursuit abnormal Saccadic   Cross-Cover Test normal   Saccades abnormal Slow throughout and hypometric corrective saccades noted to the L but difficulty to assess due to R gaze induced nystagmus.   VOR Cancellation normal   Left Head Impulse abnormal Consistent corrective saccade noted  Right Head Impulse normal   Static Acuity not examined   Dynamic Acuity not examined     Oculomotor Exam- Fixation Suppressed (Frenzel lenses)   Findings Comments  Ocular Alignment normal   Spontaneous Nystagmus abnormal Third degree R pure horizontal nystagmus noted which follows Alexander's Law  Gaze-Holding Nystagmus abnormal See above  End-Gaze Nystagmus abnormal See above  Head Shaking Nystagmus abnormal Increased intensity pure R horizontal beating nystagmus  Pressure-Induced Nystagmus not examined   Hyperventilation Induced Nystagmus not examined   Skull Vibration Induced Nystagmus not examined     BPPV TESTS: Deferred at this time due to baseline dizziness at rest  Bed Mobility Overal bed mobility: Independent             General bed mobility comments: Good sequencing and strength. No external assist required  Transfers Overall transfer level: Needs assistance Equipment used: None Transfers: Sit to/from Stand Sit to Stand: Min guard         General transfer comment: Pt demonstrates some mild imbalance with sit to stand transfers initially. She supports her knees on the bed and doesn't require external assist but is obviously unsteady  Ambulation/Gait Ambulation/Gait assistance: Min guard Gait Distance (Feet): 100 Feet Assistive device: Rolling walker (2 wheeled)       General Gait Details: Pt is able to ambulate in hallway initially without assistive device but eventually added a rolling walker for safety. She is initially somewhat unsteady without walker but once walker is added she demonstrates good balance and safety awareness. Gait speed is slow but functional for full household mobility.   Stairs            Wheelchair Mobility    Modified Rankin (Stroke Patients Only)       Balance Overall balance assessment: Needs assistance Sitting-balance support: No upper extremity supported Sitting balance-Leahy Scale: Good     Standing balance support: No upper extremity supported Standing balance-Leahy Scale: Fair Standing balance comment: Positive romberg test. Pt able to maintain static  standing balance without UE support or external support from therapist                             Pertinent Vitals/Pain Pain Assessment: No/denies pain    Home Living Family/patient expects to be discharged to:: Private residence Living Arrangements: Spouse/significant other Available Help at Discharge: Family Type of Home: House Home Access: Stairs to enter Entrance Stairs-Rails: Left Entrance Stairs-Number of Steps: 2 Home Layout: One level Home Equipment: Walker - 2 wheels;Walker - 4 wheels;Cane - single point;Shower seat - built in;Tub bench;Wheelchair -  manual      Prior Function Level of Independence: Independent         Comments: Pt previously independent with ADLs/IADLs without assistive device. No falls in the last 12 months. Drives and works part-time/full time     Journalist, newspaper   Dominant Hand: Right    Extremity/Trunk Assessment   Upper Extremity Assessment Upper Extremity Assessment: Overall WFL for tasks assessed    Lower Extremity Assessment Lower Extremity Assessment: Overall WFL for tasks assessed       Communication   Communication: No difficulties  Cognition Arousal/Alertness: Awake/alert Behavior During Therapy: WFL for tasks assessed/performed Overall Cognitive Status: Within Functional Limits for tasks assessed                                        General Comments      Exercises     Assessment/Plan    PT Assessment Patient needs continued PT services  PT Problem List Decreased balance;Decreased mobility       PT Treatment Interventions DME instruction;Gait training;Stair training;Functional mobility training;Therapeutic activities;Therapeutic exercise;Balance training;Patient/family education(vestibular therapy)    PT Goals (Current goals can be found in the Care Plan section)  Acute Rehab PT Goals Patient Stated Goal: Return to prior function at decrease dizziness PT Goal Formulation: With  patient/family Time For Goal Achievement: 07/01/19 Potential to Achieve Goals: Good    Frequency Min 2X/week   Barriers to discharge        Co-evaluation               AM-PAC PT "6 Clicks" Mobility  Outcome Measure Help needed turning from your back to your side while in a flat bed without using bedrails?: None Help needed moving from lying on your back to sitting on the side of a flat bed without using bedrails?: None Help needed moving to and from a bed to a chair (including a wheelchair)?: A Little Help needed standing up from a chair using your arms (e.g., wheelchair or bedside chair)?: None Help needed to walk in hospital room?: A Little Help needed climbing 3-5 steps with a railing? : A Little 6 Click Score: 21    End of Session Equipment Utilized During Treatment: Gait belt Activity Tolerance: Patient tolerated treatment well Patient left: in bed;with call bell/phone within reach;Other (comment);with nursing/sitter in room(sitting EOB with RN administering meds) Nurse Communication: Mobility status PT Visit Diagnosis: Unsteadiness on feet (R26.81);Dizziness and giddiness (R42)    Time: [id number] PT Time Calculation (min) (ACUTE ONLY): 56 min   Charges:   PT Evaluation $PT Eval High Complexity: 1 High PT Treatments $Gait Training: 8-22 mins        Lyndel Safe Demontae Antunes PT, DPT, GCS   [place] 06/17/2019, 10:31 AM

## 2019-06-17 NOTE — Discharge Summary (Signed)
Physician Discharge Summary  Susan Chung [id number] DOB: [birth year] DOA: 06/16/2019  PCP: Adin Hector, MD  Admit date: 06/16/2019 Discharge date: 06/17/2019  Recommendations for Outpatient Follow-up:  1. Follow up with PCP in 1-2 weeks 2. Please follow up ENT in 3-5 days 3. Please follow up with outpatient vestibular therapy 4. Please obtain BMP/CBC in one week  Home Health: outpatient vetibular therapy Equipment/Devices: [email or web address]    Discharge Condition: stable CODE STATUS: rull Diet recommendation: heart/carb modified diet  Brief/Interim Summary (HPI) Susan Chung is a 68 y.o. female with medical history significant of dizziness dating back to 2008 when she was diagnosed with Mnire's disease.  Patient had recurrent vertigo.  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  She had recurrent dizziness and in February 2019 she had a second MRI which showed a new small cerebellar infarct.  Patient is also followed for diabetes, hypertension and cervical herniated disc.  She reports that she has had recurrent dizziness on and off for several weeks.  She reports an episode that lasted a full day.  She presents today because of persistent dizziness/vertigo accompanied by nausea and weakness.  He describes both a sense of disequilibrium and imbalance as well as a room spinning type of vertigo.  She denies any severe headache, cardiac palpitations, or focal neurologic symptoms such as weakness or loss of sensation.  She has been able to ambulate.  She has not been seen by neurology for some time but does follow with her primary care physician on a regular basis.  Because of her persistent dizziness disequilibrium weakness and nausea she presents to the [facility] emergency department for evaluation   ED Course: Patient was hemodynamically stable in the emergency department.  Routine laboratory was unremarkable except for serum glucose of 196.  CT of the head was  negative for any acute change.  MRI brain was negative for any new change although old infarcts in the cerebellum are noted.  Continue to be dizzy and weak and therefore she was referred to [facility] for further observation and evaluation.  Subjective   Pt feels little better, still has some dizziness, which is worse when head position is changed. Still has some nausea vomiting.  No diarrhea or abdominal pain.  Denies chest pain, shortness of breath, fever or chills.  No unilateral weakness or numbness in extremities.  No facial droop or slurred speech.   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  MRI was unremarkable.  Her exam is suggestive of vestibular vertigo.  She carries a diagnosis of Mnire's disease from 2008.  She is taking an oral diuretic. Physical therapy for vestibular evaluation was consulted, recommeded follow up with ENT and outpatient vestibular therapy. The information of outpatient vestibular therapy will be provided by [facility]. Per PT, pt was safe and steady with a walker and the daughter plans to be with her at discharge.   -will hold maxizde -pt should follow up with ENT as soon as possible -pt should follow up with outpatient vestibular therapy. -prn meclizne  HTN: -hold home maxzide -continue home lisinopril  Type 2  diabetes mellitus without complication   -continue home Metformin  GERD: -PPI  Hypothyroidism: -Continue Synthroid  HLD: -lipitor   Discharge Instructions  Discharge Instructions    Call MD for:  difficulty breathing, headache or visual disturbances   Complete by:  As directed    Call MD for:  persistant dizziness or light-headedness   Complete by: As directed    Call MD for:  severe uncontrolled pain   Complete by: As directed    Call MD for:  temperature >100.4   Complete by: As directed    Diet - low sodium heart healthy   Complete by: As directed    Diet Carb Modified   Complete by: As directed    Increase activity slowly   Complete by: As directed      Allergies as of 06/17/2019      Reactions   Sulfa Antibiotics Rash      Medication List    STOP taking these medications   triamterene-hydrochlorothiazide 37.5-25 MG tablet Commonly known as: MAXZIDE-25     TAKE these medications   acetaminophen 650 MG CR tablet Commonly known as: TYLENOL Take 1,300 mg by mouth every 8 (eight) hours as needed for pain.   atorvastatin 20 MG tablet Commonly known as: LIPITOR Take 20 mg by mouth daily.   docusate sodium 100 MG capsule Commonly known as: COLACE Take 100 mg by mouth daily as needed for mild constipation.   levothyroxine 150 MCG tablet Commonly known as: SYNTHROID Take 150 mcg by mouth daily before breakfast.   lisinopril 2.5 MG tablet Commonly known as: ZESTRIL Take 2.5 mg by mouth daily.   meclizine 25 MG tablet Commonly known as: ANTIVERT Take 12.5-25 mg by mouth 3 (three) times daily as needed for dizziness.   metFORMIN 500 MG tablet Commonly known as: GLUCOPHAGE Take 500 mg by mouth 2 (two) times daily.   omeprazole 20 MG capsule Commonly known as: PRILOSEC Take 20 mg by mouth daily.   ondansetron 4 MG disintegrating tablet Commonly known as: ZOFRAN-ODT Take 1 tablet (4 mg total) by mouth every 8 (eight) hours as needed for nausea.   polyethylene glycol 17 g packet Commonly known as: MIRALAX / GLYCOLAX Take 17 g by mouth daily. What changed:   when to take this  reasons to take this   traMADol 50 MG tablet Commonly known as: ULTRAM Take 50 mg by mouth every 6 (six) hours as needed. for pain    traZODone 100 MG tablet Commonly known as: DESYREL Take 100 mg by mouth at bedtime.      Follow-up Information    Tama High III, MD Follow up in 1 week(s).   Specialty: Internal Medicine Contact information: [address] [facility] [place] [address] [phone]        [facility] Follow up in 3 day(s).   Contact information: [place] [address] [address] [phone]         Allergies  Allergen Reactions  . Sulfa Antibiotics Rash    Consultations:  PT   Procedures/Studies: Ct Head Wo Contrast  Result Date: 06/16/2019 CLINICAL DATA:  Persistent central vertigo EXAM: CT HEAD WITHOUT CONTRAST TECHNIQUE: Contiguous axial images were obtained from the base of the skull through the vertex without intravenous contrast. COMPARISON:  MRI 09/12/2017, CT 07/23/2006 FINDINGS: Brain: Stable region of gliosis in the right cerebellar hemisphere. No evidence of acute infarction, hemorrhage, hydrocephalus, extra-axial collection or mass lesion/mass effect.  Symmetric prominence of the ventricles, cisterns and sulci compatible with parenchymal volume loss. Patchy areas of white matter hypoattenuation are most compatible with chronic microvascular angiopathy. Vascular: Atherosclerotic calcification of the carotid siphons. No hyperdense vessel. Skull: No calvarial fracture or suspicious osseous lesion. No scalp swelling or hematoma. Sinuses/Orbits: Paranasal sinuses and mastoid air cells are predominantly clear. Orbital structures are unremarkable aside from prior lens extractions. Other: None IMPRESSION: No acute intracranial abnormality. Remote infarcts in the right cerebellum, unchanged from priors. 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 Electronically Signed   By: Lovena Le M.D.   On: 06/16/2019 14:56   Mr Brain Wo Contrast  Result Date: 06/16/2019 CLINICAL DATA:  Vertigo, persistent,  central. EXAM: MRI HEAD WITHOUT CONTRAST TECHNIQUE: Multiplanar, multiecho pulse sequences of the brain and surrounding structures were obtained without intravenous contrast. COMPARISON:  Head CT 06/16/2019, brain MRI 10/10/2017 FINDINGS: Brain: There is no evidence of acute infarct. No evidence of intracranial mass. No midline shift or extra-axial fluid collection. No chronic intracranial blood products. Redemonstrated remote infarcts within the right cerebellum. Minimal scattered T2/FLAIR hyperintensity within cerebral white matter is nonspecific, but consistent with chronic small vessel ischemic disease. Cerebral volume is normal for age. Vascular: Flow voids maintained within the proximal large arterial vessels. Skull and upper cervical spine: No focal marrow lesion Sinuses/Orbits: Bilateral lens replacements. Minimal ethmoid sinus mucosal thickening. No significant mastoid effusion. IMPRESSION: 1. No evidence of acute intracranial abnormality. 2. Redemonstrated remote infarcts within the right cerebellum. 3. Background of mild chronic small vessel ischemic disease. Electronically Signed   By: Kellie Simmering DO   On: 06/16/2019 16:59      Discharge Exam: Vitals:   06/17/19 0400 06/17/19 0934  BP: (!) 114/57 120/73  Pulse: (!) 59 66  Resp: 18   Temp: 97.7 F (36.5 C) (!) 97.5 F (36.4 C)  SpO2: 95% 97%   Vitals:   06/16/19 2312 06/17/19 0134 06/17/19 0400 06/17/19 0934  BP:  114/62 (!) 114/57 120/73  Pulse: 66 65 (!) 59 66  Resp: 16 18 18    Temp:   97.7 F (36.5 C) (!) 97.5 F (36.4 C)  TempSrc:   Oral Oral  SpO2: 93% 91% 95% 97%  Weight:      Height:        General: Pt is alert, awake, not in acute distress Cardiovascular: RRR, S1/S2 +, no rubs, no gallops Respiratory: CTA bilaterally, no wheezing, no rhonchi Abdominal: Soft, NT, ND, bowel sounds + Extremities: no edema, no cyanosis    The results of significant diagnostics from this hospitalization (including imaging,  microbiology, ancillary and laboratory) are listed below for reference.     Microbiology: No results found for this or any previous visit (from the past 240 hour(s)).   Labs: BNP (last 3 results) No results for input(s): BNP in the last 8760 hours. Basic Metabolic Panel: Recent Labs  Lab 06/16/19 1036  NA 137  K 3.9  CL 98  CO2 24  GLUCOSE 196*  BUN 17  CREATININE 0.75  CALCIUM 9.5   Liver Function Tests: Recent Labs  Lab 06/16/19 1036  AST 37  ALT 31  ALKPHOS 77  BILITOT 0.6  PROT 7.6  ALBUMIN 4.1   Recent Labs  Lab 06/16/19 1036  LIPASE 17   No results for input(s): AMMONIA in the last 168 hours. CBC: Recent Labs  Lab 06/16/19 1036  WBC 8.7  HGB 13.8  HCT 40.9  MCV 87.4  PLT 185  Cardiac Enzymes: No results for input(s): CKTOTAL, CKMB, CKMBINDEX, TROPONINI in the last 168 hours. BNP: Invalid input(s): POCBNP CBG: No results for input(s): GLUCAP in the last 168 hours. D-Dimer No results for input(s): DDIMER in the last 72 hours. Hgb A1c No results for input(s): HGBA1C in the last 72 hours. Lipid Profile No results for input(s): CHOL, HDL, LDLCALC, TRIG, CHOLHDL, LDLDIRECT in the last 72 hours. Thyroid function studies No results for input(s): TSH, T4TOTAL, T3FREE, THYROIDAB in the last 72 hours.  Invalid input(s): FREET3 Anemia work up No results for input(s): VITAMINB12, FOLATE, FERRITIN, TIBC, IRON, RETICCTPCT in the last 72 hours. Urinalysis    Component Value Date/Time   COLORURINE YELLOW (A) 06/16/2019 1445   APPEARANCEUR HAZY (A) 06/16/2019 1445   APPEARANCEUR Clear 06/12/2014 0408   LABSPEC 1.016 06/16/2019 1445   LABSPEC 1.027 06/12/2014 0408   PHURINE 7.0 06/16/2019 1445   GLUCOSEU NEGATIVE 06/16/2019 1445   GLUCOSEU Negative 06/12/2014 0408   HGBUR NEGATIVE 06/16/2019 1445   BILIRUBINUR NEGATIVE 06/16/2019 1445   BILIRUBINUR Negative 06/12/2014 0408   KETONESUR 20 (A) 06/16/2019 1445   PROTEINUR NEGATIVE 06/16/2019 1445    NITRITE NEGATIVE 06/16/2019 1445   LEUKOCYTESUR NEGATIVE 06/16/2019 1445   LEUKOCYTESUR Negative 06/12/2014 0408   Sepsis Labs Invalid input(s): PROCALCITONIN,  WBC,  LACTICIDVEN Microbiology No results found for this or any previous visit (from the past 240 hour(s)).  Time coordinating discharge:  35 minutes.   SIGNED:  Ivor Costa, DO [facility] 06/17/2019, 12:24 PM Pager is on [place]  If 7PM-7AM, please contact night-coverage [email or web address] Password [email or web address]

## 2019-06-17 NOTE — H&P (Signed)
History and Physical    Susan REINIG [id number] DOB: [birth year] DOA: 06/16/2019  PCP: Adin Hector, MD (Confirm with patient/family/NH records and if not entered, this has to be entered at [facility] point of entry) Patient coming from: Patient is coming from home  I have personally briefly reviewed patient's old medical records in [place]  Chief Complaint: Refractory dizziness/vertigo  HPI: Susan Chung is a 68 y.o. female with medical history significant of dizziness dating back to 2008 when she was diagnosed with Mnire's disease.  Patient had recurrent vertigo.  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  She had recurrent dizziness and in February 2019 she had a second MRI which showed a new small cerebellar infarct.  Patient is also followed for diabetes, hypertension and cervical herniated disc.  She reports that she has had recurrent dizziness on and off for several weeks.  She reports an episode that lasted a full day.  She presents today because of persistent dizziness/vertigo accompanied by nausea and weakness.  He describes both a sense of disequilibrium and imbalance as well as a room spinning type of vertigo.  She denies any severe headache, cardiac palpitations, or focal neurologic symptoms such as weakness or loss of sensation.  She has been able to ambulate.  She has not been seen by neurology for some time but does follow with her primary care physician on a regular basis.  Because of her persistent dizziness disequilibrium weakness and nausea she presents to the [facility] emergency department for evaluation   ED Course: Patient was hemodynamically stable in the emergency department.  Routine laboratory was unremarkable except for serum glucose of 196.  CT of the head was negative for any acute change.  MRI brain was negative for any new change although old infarcts in the cerebellum are noted.  Continue to be dizzy and weak and therefore she was  referred to [facility] for further observation and evaluation.  Review of Systems: As per HPI otherwise 10 point review of systems negative.    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  She has 2 daughters and 4 grandchildren.  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  Patient is on disability notes area related issues including bad knees.   reports that she has never smoked. She has never used smokeless tobacco. She reports that she does not drink alcohol or use drugs.  Allergies  Allergen Reactions   Sulfa Antibiotics Rash    Family History  Problem Relation Age of Onset   Breast cancer Neg Hx      Prior to Admission medications   Medication Sig Start Date End Date Taking? Authorizing Provider  acetaminophen (TYLENOL) 650 MG CR tablet Take 1,300 mg by mouth every 8 (eight) hours as needed for pain.    Yes [provider]  atorvastatin (LIPITOR) 20 MG tablet Take 20 mg by mouth daily. 11/09/17  Yes [provider]  docusate sodium (COLACE) 100 MG capsule Take 100 mg by mouth daily as needed for mild constipation.    Yes [provider]  levothyroxine (SYNTHROID) 150 MCG tablet Take 150 mcg by mouth daily before breakfast.    Yes [provider]  lisinopril (PRINIVIL,ZESTRIL) 2.5 MG tablet Take 2.5 mg by mouth daily. 08/25/16  Yes [provider]  meclizine (ANTIVERT) 25 MG tablet Take 12.5-25 mg by mouth 3 (three) times daily as needed for dizziness.  05/18/15  Yes [provider]  metFORMIN (GLUCOPHAGE) 500 MG tablet Take 500 mg by mouth 2 (two) times daily. 01/22/16  Yes [provider]  polyethylene glycol (MIRALAX / GLYCOLAX) packet Take 17 g by mouth daily. Patient taking differently: Take 17 g by mouth daily as needed.  09/08/16  Yes Piscoya, Jacqulyn Bath, MD  traMADol (ULTRAM) 50 MG tablet Take 50 mg by mouth every 6 (six) hours as needed. for pain 07/27/16  Yes [provider]  traZODone (DESYREL) 100 MG tablet Take 100 mg by mouth at bedtime. 04/27/19  Yes [provider]  triamterene-hydrochlorothiazide (MAXZIDE-25) 37.5-25 MG tablet Take 1 tablet by mouth daily. 10/18/17  Yes [provider]  omeprazole (PRILOSEC) 20 MG capsule Take 20 mg by mouth daily. 08/25/16   [provider]  ondansetron (ZOFRAN-ODT) 4 MG disintegrating tablet Take 1 tablet (4 mg total) by mouth every 8 (eight) hours as needed for nausea. Patient not taking: Reported on 06/16/2019 08/10/18   Victorino Dike, FNP    Physical Exam: Vitals:   06/16/19 2116 06/16/19 2200 06/16/19 2312 06/17/19 0134  BP: 137/67 118/69  114/62  Pulse: 67 62 66 65  Resp: 18 16 16 18   Temp:      TempSrc:      SpO2: 95% 94% 93% 91%  Weight:      Height:        Constitutional: NAD, calm, comfortable Vitals:   06/16/19 2116 06/16/19 2200 06/16/19 2312 06/17/19 0134  BP: 137/67 118/69  114/62  Pulse: 67 62 66 65  Resp: 18 16 16 18   Temp:      TempSrc:      SpO2: 95% 94% 93% 91%  Weight:      Height:       General appearance:  -Obese woman in no acute distress  eyes: PERRL, lids and conjunctivae normal ENMT: Mucous membranes are moist. Posterior pharynx clear of any exudate or lesions.Normal dentition.  Neck: normal, supple, no masses, no thyromegaly Respiratory: clear to auscultation bilaterally, no  wheezing, no crackles. Normal respiratory effort. No accessory muscle use.  Cardiovascular: Regular rate and rhythm, no murmurs / rubs / gallops. No extremity edema. 2+ pedal pulses. No carotid bruits.  Abdomen: Obese, mild diffuse tenderness, no masses palpated. No hepatosplenomegaly.  Exam hindered by girth bowel sounds positive.  Musculoskeletal: no clubbing / cyanosis. No joint deformity upper and lower extremities. Good ROM, no contractures. Normal muscle tone.  Skin: no rashes, lesions, ulcers. No induration Neurologic: CN 2-12 grossly intact.  Mild horizontal nystagmus noted with rightward gaze.  With rapid head movement to the right patient has an increase in station of dizziness.  Rapid head turning to the left she has less dizziness.  Patient had normal finger-nose maneuver.  She had normal range of motion.  She had normal grip strength.  He is able lift both legs  up off the tab le.  She had 2+ deep tendon reflexes at the patellar tendon. sensation to light touch is intact,.  Psychiatric: Normal judgment and insight. Alert and oriented x 3. Normal mood.     Labs on Admission: I have personally reviewed following labs and imaging studies  CBC: Recent Labs  Lab 06/16/19 1036  WBC 8.7  HGB 13.8  HCT 40.9  MCV 87.4  PLT 123XX123   Basic Metabolic Panel: Recent Labs  Lab 06/16/19 1036  NA 137  K 3.9  CL 98  CO2 24  GLUCOSE 196*  BUN 17  CREATININE 0.75  CALCIUM 9.5   GFR: Estimated Creatinine Clearance: 80.5 mL/min (by C-G formula based on SCr of 0.75 mg/dL). Liver Function Tests: Recent Labs  Lab 06/16/19 1036  AST 37  ALT 31  ALKPHOS 77  BILITOT 0.6  PROT 7.6  ALBUMIN 4.1   Recent Labs  Lab 06/16/19 1036  LIPASE 17   No results for input(s): AMMONIA in the last 168 hours. Coagulation Profile: No results for input(s): INR, PROTIME in the last 168 hours. Cardiac Enzymes: No results for input(s): CKTOTAL, CKMB, CKMBINDEX, TROPONINI in the last 168 hours. BNP  (last 3 results) No results for input(s): PROBNP in the last 8760 hours. HbA1C: No results for input(s): HGBA1C in the last 72 hours. CBG: No results for input(s): GLUCAP in the last 168 hours. Lipid Profile: No results for input(s): CHOL, HDL, LDLCALC, TRIG, CHOLHDL, LDLDIRECT in the last 72 hours. Thyroid Function Tests: No results for input(s): TSH, T4TOTAL, FREET4, T3FREE, THYROIDAB in the last 72 hours. Anemia Panel: No results for input(s): VITAMINB12, FOLATE, FERRITIN, TIBC, IRON, RETICCTPCT in the last 72 hours. Urine analysis:    Component Value Date/Time   COLORURINE YELLOW (A) 06/16/2019 1445   APPEARANCEUR HAZY (A) 06/16/2019 1445   APPEARANCEUR Clear 06/12/2014 0408   LABSPEC 1.016 06/16/2019 1445   LABSPEC 1.027 06/12/2014 0408   PHURINE 7.0 06/16/2019 1445   GLUCOSEU NEGATIVE 06/16/2019 1445   GLUCOSEU Negative 06/12/2014 0408   HGBUR NEGATIVE 06/16/2019 1445   BILIRUBINUR NEGATIVE 06/16/2019 1445   BILIRUBINUR Negative 06/12/2014 0408   KETONESUR 20 (A) 06/16/2019 1445   PROTEINUR NEGATIVE 06/16/2019 1445   NITRITE NEGATIVE 06/16/2019 1445   LEUKOCYTESUR NEGATIVE 06/16/2019 1445   LEUKOCYTESUR Negative 06/12/2014 0408    Radiological Exams on Admission: Ct Head Wo Contrast  Result Date: 06/16/2019 CLINICAL DATA:  Persistent central vertigo EXAM: CT HEAD WITHOUT CONTRAST TECHNIQUE: Contiguous axial images were obtained from the base of the skull through the vertex without intravenous contrast. COMPARISON:  MRI 09/12/2017, CT 07/23/2006 FINDINGS: Brain: Stable region of gliosis in the right cerebellar hemisphere. No evidence of acute infarction, hemorrhage, hydrocephalus, extra-axial collection or mass lesion/mass effect. Symmetric prominence of the ventricles, cisterns and sulci compatible with parenchymal volume loss. Patchy areas of white matter hypoattenuation are most compatible with chronic microvascular angiopathy. Vascular: Atherosclerotic calcification of  the carotid siphons. No hyperdense vessel. Skull: No calvarial fracture or suspicious osseous lesion. No scalp swelling or hematoma. Sinuses/Orbits: Paranasal sinuses and mastoid air cells are predominantly clear. Orbital structures are unremarkable aside from prior lens extractions. Other: None IMPRESSION: No acute intracranial abnormality. Remote infarcts in the right cerebellum, unchanged from priors. 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 Electronically Signed   By: Lovena Le M.D.   On: 06/16/2019 14:56   Mr Brain Wo Contrast  Result Date: 06/16/2019 CLINICAL DATA:  Vertigo, persistent, central. EXAM: MRI HEAD WITHOUT  CONTRAST TECHNIQUE: Multiplanar, multiecho pulse sequences of the brain and surrounding structures were obtained without intravenous contrast. COMPARISON:  Head CT 06/16/2019, brain MRI 10/10/2017 FINDINGS: Brain: There is no evidence of acute infarct. No evidence of intracranial mass. No midline shift or extra-axial fluid collection. No chronic intracranial blood products. Redemonstrated remote infarcts within the right cerebellum. Minimal scattered T2/FLAIR hyperintensity within cerebral white matter is nonspecific, but consistent with chronic small vessel ischemic disease. Cerebral volume is normal for age. Vascular: Flow voids maintained within the proximal large arterial vessels. Skull and upper cervical spine: No focal marrow lesion Sinuses/Orbits: Bilateral lens replacements. Minimal ethmoid sinus mucosal thickening. No significant mastoid effusion. IMPRESSION: 1. No evidence of acute intracranial abnormality. 2. Redemonstrated remote infarcts within the right cerebellum. 3. Background of mild chronic small vessel ischemic disease. Electronically Signed   By: Kellie Simmering DO   On: 06/16/2019 16:59    EKG: Independently reviewed.  EKG revealed a junctional rhythm which is new.  No other acute changes are noted.  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 (For example, if patient is on BP meds at home and you resume or decide to hold them, it is a problem that needs to be her. Same for CAD, COPD, HLD and so on)   1.  Vertigo -patient's exam was notable for slow beat horizontal nystagmus with rightward gaze and this elicited by rapid head movement.  MRI was unremarkable.  Her exam is suggestive of vestibular vertigo.  She carries a diagnosis of Mnire's disease from 2008.  She is taking an oral diuretic. Plan medical observation admission  Physical therapy for vestibular evaluation and vestibular exercises  Will continue her present medications.  2.  Pretension -patient is generally well controlled we will continue her home medications  3.  Diabetes -patient reports her CBGs are generally well controlled.  He had a recent A1c result not available.  She sees her primary care physician every 3 months for diabetic monitoring. Plan we will continue the patient's home regimen  4.  Hypothyroidism -to new home medications and will get a TSH  DVT prophylaxis: Lovenox (Lovenox/Heparin/SCD's/anticoagulated/None (if comfort care) Code Status: Full code (Full/Partial (specify details) Family Communication: Deferred at patient's request (Specify name, relationship. Do not write "discussed with patient". Specify tel # if discussed over the phone) Disposition Plan: Home when medically stable (specify when and where you expect patient to be discharged) Consults called: None (with names) Admission status: Observation (inpatient / obs / tele / medical floor / SDU)   Adella Hare MD [facility] Pager [phone]  If 7PM-7AM, please contact night-coverage [email or web address] Password [email or web address]  06/17/2019, 2:04 AM

## 2019-08-19 ENCOUNTER — Other Ambulatory Visit: Payer: Self-pay | Admitting: Internal Medicine

## 2019-08-19 DIAGNOSIS — Z1231 Encounter for screening mammogram for malignant neoplasm of breast: Secondary | ICD-10-CM

## 2019-10-01 ENCOUNTER — Ambulatory Visit
Admission: RE | Admit: 2019-10-01 | Discharge: 2019-10-01 | Disposition: A | Discharge status: Home / Self Care | Payer: Medicare HMO | Source: Ambulatory Visit | Attending: Internal Medicine | Admitting: Internal Medicine

## 2019-10-01 DIAGNOSIS — Z1231 Encounter for screening mammogram for malignant neoplasm of breast: Secondary | ICD-10-CM

## 2019-12-20 ENCOUNTER — Other Ambulatory Visit: Payer: Self-pay | Admitting: Internal Medicine

## 2019-12-20 DIAGNOSIS — M7989 Other specified soft tissue disorders: Secondary | ICD-10-CM

## 2019-12-23 ENCOUNTER — Ambulatory Visit
Admission: RE | Admit: 2019-12-23 | Discharge: 2019-12-23 | Disposition: A | Discharge status: Home / Self Care | Payer: Medicare HMO | Source: Ambulatory Visit | Attending: Internal Medicine | Admitting: Internal Medicine

## 2019-12-23 ENCOUNTER — Other Ambulatory Visit: Payer: Self-pay

## 2019-12-23 DIAGNOSIS — M7989 Other specified soft tissue disorders: Secondary | ICD-10-CM | POA: Insufficient documentation

## 2020-08-31 ENCOUNTER — Other Ambulatory Visit: Payer: Self-pay | Admitting: Internal Medicine

## 2020-08-31 DIAGNOSIS — Z1231 Encounter for screening mammogram for malignant neoplasm of breast: Secondary | ICD-10-CM

## 2020-10-03 ENCOUNTER — Ambulatory Visit
Admission: RE | Admit: 2020-10-03 | Discharge: 2020-10-03 | Disposition: A | Discharge status: Home / Self Care | Payer: Medicare HMO | Source: Ambulatory Visit | Attending: Internal Medicine | Admitting: Internal Medicine

## 2020-10-03 ENCOUNTER — Other Ambulatory Visit: Payer: Self-pay

## 2020-10-03 DIAGNOSIS — Z1231 Encounter for screening mammogram for malignant neoplasm of breast: Secondary | ICD-10-CM | POA: Insufficient documentation

## 2021-10-12 ENCOUNTER — Other Ambulatory Visit: Payer: Self-pay | Admitting: Internal Medicine

## 2021-10-12 DIAGNOSIS — Z1231 Encounter for screening mammogram for malignant neoplasm of breast: Secondary | ICD-10-CM

## 2021-11-20 ENCOUNTER — Ambulatory Visit
Admission: RE | Admit: 2021-11-20 | Discharge: 2021-11-20 | Disposition: A | Discharge status: Home / Self Care | Payer: Medicare HMO | Source: Ambulatory Visit | Attending: Internal Medicine | Admitting: Internal Medicine

## 2021-11-20 DIAGNOSIS — Z1231 Encounter for screening mammogram for malignant neoplasm of breast: Secondary | ICD-10-CM | POA: Diagnosis present

## 2022-08-09 ENCOUNTER — Emergency Department: Payer: Medicare HMO

## 2022-08-09 ENCOUNTER — Other Ambulatory Visit: Payer: Self-pay

## 2022-08-09 ENCOUNTER — Observation Stay
Admission: EM | Admit: 2022-08-09 | Discharge: 2022-08-10 | Disposition: A | Discharge status: Home / Self Care | Payer: Medicare HMO | Attending: Student | Admitting: Student

## 2022-08-09 DIAGNOSIS — K56600 Partial intestinal obstruction, unspecified as to cause: Secondary | ICD-10-CM | POA: Diagnosis present

## 2022-08-09 DIAGNOSIS — E119 Type 2 diabetes mellitus without complications: Secondary | ICD-10-CM

## 2022-08-09 DIAGNOSIS — I69398 Other sequelae of cerebral infarction: Secondary | ICD-10-CM

## 2022-08-09 DIAGNOSIS — R42 Dizziness and giddiness: Secondary | ICD-10-CM

## 2022-08-09 DIAGNOSIS — E039 Hypothyroidism, unspecified: Secondary | ICD-10-CM | POA: Diagnosis present

## 2022-08-09 DIAGNOSIS — Z8673 Personal history of transient ischemic attack (TIA), and cerebral infarction without residual deficits: Secondary | ICD-10-CM

## 2022-08-09 DIAGNOSIS — Z7982 Long term (current) use of aspirin: Secondary | ICD-10-CM | POA: Insufficient documentation

## 2022-08-09 DIAGNOSIS — Z789 Other specified health status: Secondary | ICD-10-CM | POA: Insufficient documentation

## 2022-08-09 DIAGNOSIS — R29898 Other symptoms and signs involving the musculoskeletal system: Secondary | ICD-10-CM | POA: Diagnosis not present

## 2022-08-09 DIAGNOSIS — M542 Cervicalgia: Secondary | ICD-10-CM | POA: Insufficient documentation

## 2022-08-09 DIAGNOSIS — R202 Paresthesia of skin: Secondary | ICD-10-CM | POA: Diagnosis not present

## 2022-08-09 DIAGNOSIS — Z79899 Other long term (current) drug therapy: Secondary | ICD-10-CM | POA: Insufficient documentation

## 2022-08-09 DIAGNOSIS — Z7984 Long term (current) use of oral hypoglycemic drugs: Secondary | ICD-10-CM | POA: Insufficient documentation

## 2022-08-09 DIAGNOSIS — I639 Cerebral infarction, unspecified: Secondary | ICD-10-CM

## 2022-08-09 DIAGNOSIS — Z1152 Encounter for screening for COVID-19: Secondary | ICD-10-CM | POA: Diagnosis not present

## 2022-08-09 DIAGNOSIS — I10 Essential (primary) hypertension: Secondary | ICD-10-CM | POA: Diagnosis not present

## 2022-08-09 DIAGNOSIS — R531 Weakness: Secondary | ICD-10-CM | POA: Diagnosis present

## 2022-08-09 DIAGNOSIS — Z85828 Personal history of other malignant neoplasm of skin: Secondary | ICD-10-CM | POA: Insufficient documentation

## 2022-08-09 LAB — BASIC METABOLIC PANEL
Anion gap: 10 (ref 5–15)
BUN: 15 mg/dL (ref 8–23)
CO2: 24 mmol/L (ref 22–32)
Calcium: 9.3 mg/dL (ref 8.9–10.3)
Chloride: 101 mmol/L (ref 98–111)
Creatinine, Ser: 0.63 mg/dL (ref 0.44–1.00)
GFR, Estimated: >60 mL/min (ref >60)
Glucose, Bld: 127 mg/dL — ABNORMAL HIGH (ref 70–99)
Potassium: 4.3 mmol/L (ref 3.5–5.1)
Sodium: 135 mmol/L (ref 135–145)

## 2022-08-09 LAB — CBC
HCT: 43.8 % (ref 36.0–46.0)
Hemoglobin: 13.9 g/dL (ref 12.0–15.0)
MCH: 29.1 pg (ref 26.0–34.0)
MCHC: 31.7 g/dL (ref 30.0–36.0)
MCV: 91.6 fL (ref 80.0–100.0)
Platelets: 219 10*3/uL (ref 150–400)
RBC: 4.78 MIL/uL (ref 3.87–5.11)
RDW: 13.2 % (ref 11.5–15.5)
WBC: 9.9 10*3/uL (ref 4.0–10.5)
nRBC: 0 % (ref 0.0–0.2)

## 2022-08-09 LAB — URINALYSIS, ROUTINE W REFLEX MICROSCOPIC
Bacteria, UA: NONE SEEN
Bilirubin Urine: NEGATIVE
Glucose, UA: NEGATIVE mg/dL
Hgb urine dipstick: NEGATIVE
Ketones, ur: NEGATIVE mg/dL
Leukocytes,Ua: NEGATIVE
Nitrite: NEGATIVE
Protein, ur: NEGATIVE mg/dL
Specific Gravity, Urine: 1.009 (ref 1.005–1.030)
pH: 7 (ref 5.0–8.0)

## 2022-08-09 LAB — RESP PANEL BY RT-PCR (RSV, FLU A&B, COVID)  RVPGX2
Influenza A by PCR: NEGATIVE
Influenza B by PCR: NEGATIVE
Resp Syncytial Virus by PCR: NEGATIVE
SARS Coronavirus 2 by RT PCR: NEGATIVE

## 2022-08-09 MED ORDER — DEXAMETHASONE SODIUM PHOSPHATE 10 MG/ML IJ SOLN
10.0000 mg | Freq: Once | INTRAMUSCULAR | Status: AC
  Administered 2022-08-10: 10 mg via INTRAVENOUS
  Filled 2022-08-09: qty 1

## 2022-08-09 MED ORDER — ONDANSETRON HCL 4 MG/2ML IJ SOLN
4.0000 mg | Freq: Once | INTRAMUSCULAR | Status: AC
  Administered 2022-08-10: 4 mg via INTRAVENOUS
  Filled 2022-08-09: qty 2

## 2022-08-09 MED ORDER — MORPHINE SULFATE (PF) 4 MG/ML IV SOLN
4.0000 mg | Freq: Once | INTRAVENOUS | Status: AC
  Administered 2022-08-10: 4 mg via INTRAVENOUS
  Filled 2022-08-09: qty 1

## 2022-08-09 MED ORDER — IOHEXOL 300 MG/ML  SOLN
75.0000 mL | Freq: Once | INTRAMUSCULAR | Status: AC | PRN
  Administered 2022-08-09: 75 mL via INTRAVENOUS

## 2022-08-09 NOTE — ED Provider Notes (Signed)
  Physical Exam  BP (!) 141/61 (BP Location: Left Arm)   Pulse (!) 56   Temp 98 F (36.7 C) (Oral)   Resp 16   SpO2 98%   Physical Exam  Procedures  Procedures  ED Course / MDM   Clinical Course as of 08/09/22 2346  Fri Aug 09, 2022  2303 SpO2: 99 % [JW]    Clinical Course User Index [JW] Lannie Fields, PA-C   Medical Decision Making Amount and/or Complexity of Data Reviewed Labs: ordered. Radiology: ordered.  Risk Prescription drug management. Decision regarding hospitalization.   Assumed patient care from Dr. Harvest Dark.  MRI of the cervical spine indicates mild cervical canal stenosis.  No acute abnormality of the thoracic spine.  I reviewed MRI reports with neurosurgeon on-call, Dr. Tyler Pita.  Dr. Tamala Julian plans to see patient at bedside when he arrives at the hospital.  Patient was admitted to the hospitalist service for pain management and PT consult under the care of Dr. Dione Plover.        Vallarie Mare [place], PA-C 08/09/22 2350    Harvest Dark, MD 08/10/22 2328

## 2022-08-09 NOTE — ED Notes (Signed)
MRI ON THE PHONE WITH PATIENT FOR SCREENING.

## 2022-08-09 NOTE — ED Triage Notes (Signed)
Pt presents to ED with c/o of vertigo attack yesterday. Pt states bilateral leg weakness and R side of face is tingling that started yesterday. All s/s started yesterday.  Pt states HX of vertigo. No facial droop noted. Pt speaking clear, NAD noted.   Pt states HX of TIA's.

## 2022-08-09 NOTE — H&P (Signed)
History and Physical    Patient: Susan Chung DOB: 1951-03-02 DOA: 08/09/2022 DOS: the patient was seen and examined on 08/10/2022 PCP: Adin Hector, MD  Patient coming from: Home  Chief Complaint:  Chief Complaint  Patient presents with   Weakness   HPI: Susan Chung is a 72 y.o. 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  Patient reports current symptoms started yesterday evening.  Susan Chung had a bout of vertigo that was much more worse than her usual attacks, and this was followed by the development of significant right leg weakness as well as a feeling of tingles and puffiness in the right side of her face.  Susan Chung reports that Susan Chung has not missed any of her medications recently.  Susan Chung thinks earlier this week Susan Chung may have lifted an overly heavy bag of dog food but no other significant exertion or lifting over the past few days.  In the ED vital signs were unremarkable.  CBC, BMP, and respiratory viral panel were all unremarkable.  EKG without acute ischemic changes.  Given history patient underwent extensive imaging workup.  CT head and MRI brain both showed remote right cerebellar infarct with no acute abnormalities.  CT maxillofacial with contrast showed no acute findings.  MRI of the cervical thoracic and lumbar spines showed no red flags, there were diffuse age-related changes as well as a disc bulge at L4-L5 on the left side.  Right lower extremity duplex was negative for DVT.  Case was discussed with on-call neurosurgeon Dr. Tamala Julian who is en route to hospital and plans to see patient in person.  Susan Chung was admitted for further management.   Review of Systems: As mentioned in the history of present illness. All other systems reviewed and are negative.  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 Susan Chung has never used smokeless tobacco. Susan Chung reports that Susan Chung does not drink alcohol and does not use drugs.  Allergies  Allergen Reactions   Sulfa Antibiotics Rash    Family History  Problem Relation Age of Onset   Breast cancer Neg Hx     Prior to Admission medications   Medication Sig Start Date End Date Taking? Authorizing Provider  aspirin EC 81 MG tablet Take 81 mg by mouth daily.   Yes [provider]  atorvastatin (LIPITOR) 20 MG tablet Take 10 mg by mouth daily. 11/09/17  Yes [provider]  glipiZIDE (GLUCOTROL) 10 MG tablet Take 10 mg by mouth daily before breakfast. 12/24/21  Yes [provider]  levothyroxine (SYNTHROID) 125 MCG tablet Take 125 mcg by mouth daily before breakfast. 04/15/22 04/15/23 Yes [provider]  lisinopril (PRINIVIL,ZESTRIL) 2.5 MG tablet Take 2.5 mg by mouth daily. 08/25/16  Yes [provider]  metFORMIN (GLUCOPHAGE-XR) 500 MG 24 hr tablet Take 1,000 mg by mouth daily with supper. 03/18/22  Yes [provider]  omeprazole (PRILOSEC) 20 MG capsule Take 20 mg by mouth daily. 08/25/16  Yes [provider]  triamterene-hydrochlorothiazide (MAXZIDE-25) 37.5-25 MG tablet Take 1 tablet by mouth daily. 01/21/22  Yes [provider]  TRULICITY 2.11 HE/1.7EY SOPN Inject 0.75 mg into the skin once a week. 05/23/22  Yes [provider]  acetaminophen (TYLENOL) 650 MG CR tablet Take 1,300 mg by mouth every 8 (eight)  hours as needed for pain.     [provider]  amoxicillin-clavulanate (AUGMENTIN) 875-125 MG tablet Take 1 tablet by mouth 2 (two) times daily. Patient not taking: Reported on 08/09/2022 07/27/22   [provider]    Physical Exam: Vitals:   08/09/22 1010 08/09/22 1459 08/09/22 2325  BP: 121/72 (!) 136/59 (!) 141/61  Pulse: (!) 57 (!) 55 (!) 56  Resp: '18 18 16  '$ Temp: 98.9 F (37.2 C) 98 F (36.7 C) 98 F (36.7 C)  TempSrc: Oral Oral Oral  SpO2: 99% 98% 98%   Physical Exam Vitals reviewed.  Constitutional:      General: Susan Chung is not in acute distress.    Appearance: Normal appearance. Susan Chung is not ill-appearing, toxic-appearing or diaphoretic.  HENT:     Head: Normocephalic and atraumatic.     Mouth/Throat:     Mouth: Mucous membranes are moist.  Eyes:     General: No scleral icterus. Cardiovascular:     Rate and Rhythm: Normal rate and regular rhythm.     Heart sounds: Normal heart sounds.  Pulmonary:     Effort: Pulmonary effort is normal.     Breath sounds: Normal breath sounds.  Abdominal:     General: Abdomen is flat. Bowel sounds are normal.     Palpations: Abdomen is soft.  Musculoskeletal:     Right lower leg: No edema.     Left lower leg: No edema.  Skin:    General: Skin is warm and dry.  Neurological:     Mental Status: Susan Chung is alert.     Comments: Cranial nerves intact.  Strength 5 out of 5 in upper extremities bilaterally.  Left lower extremity with strength 5 out of 5 throughout.  Strength 4-5 throughout right lower extremity.  No clonus bilaterally.  Reflexes 2+ on left lower extremity, 1+ to absent on right side.  Negative pronator drift.      Data Reviewed: Results for orders placed or performed during the hospital encounter of 08/09/22 (from the past 24 hour(s))  Urinalysis, Routine w reflex microscopic     Status: Abnormal   Collection Time: 08/09/22 10:12 AM  Result Value Ref Range   Color, Urine STRAW (A) YELLOW   APPearance  CLEAR (A) CLEAR   Specific Gravity, Urine 1.009 1.005 - 1.030   pH 7.0 5.0 - 8.0   Glucose, UA NEGATIVE NEGATIVE mg/dL   Hgb urine dipstick NEGATIVE NEGATIVE   Bilirubin Urine NEGATIVE NEGATIVE   Ketones, ur NEGATIVE NEGATIVE mg/dL   Protein, ur NEGATIVE NEGATIVE mg/dL   Nitrite NEGATIVE NEGATIVE   Leukocytes,Ua NEGATIVE NEGATIVE   RBC / HPF 0-5 0 - 5 RBC/hpf   WBC, UA 0-5 0 - 5 WBC/hpf   Bacteria, UA NONE SEEN NONE SEEN   Squamous Epithelial / HPF 0-5 0 - 5 /HPF  Basic metabolic panel     Status: Abnormal   Collection Time: 08/09/22 10:23 AM  Result Value Ref Range   Sodium 135 135 - 145 mmol/L   Potassium 4.3 3.5 - 5.1  mmol/L   Chloride 101 98 - 111 mmol/L   CO2 24 22 - 32 mmol/L   Glucose, Bld 127 (H) 70 - 99 mg/dL   BUN 15 8 - 23 mg/dL   Creatinine, Ser 0.63 0.44 - 1.00 mg/dL   Calcium 9.3 8.9 - 10.3 mg/dL   GFR, Estimated >60 >60 mL/min   Anion gap 10 5 - 15  CBC     Status: None   Collection Time: 08/09/22 10:23 AM  Result Value Ref Range   WBC 9.9 4.0 - 10.5 K/uL   RBC 4.78 3.87 - 5.11 MIL/uL   Hemoglobin 13.9 12.0 - 15.0 g/dL   HCT 43.8 36.0 - 46.0 %   MCV 91.6 80.0 - 100.0 fL   MCH 29.1 26.0 - 34.0 pg   MCHC 31.7 30.0 - 36.0 g/dL   RDW 13.2 11.5 - 15.5 %   Platelets 219 150 - 400 K/uL   nRBC 0.0 0.0 - 0.2 %  Resp panel by RT-PCR (RSV, Flu A&B, Covid) Anterior Nasal Swab     Status: None   Collection Time: 08/09/22  6:18 PM   Specimen: Anterior Nasal Swab  Result Value Ref Range   SARS Coronavirus 2 by RT PCR NEGATIVE NEGATIVE   Influenza A by PCR NEGATIVE NEGATIVE   Influenza B by PCR NEGATIVE NEGATIVE   Resp Syncytial Virus by PCR NEGATIVE NEGATIVE   MR Cervical Spine Wo Contrast CLINICAL DATA:  Initial evaluation for neck pain, cervical radiculopathy.  EXAM: MRI CERVICAL SPINE WITHOUT CONTRAST  TECHNIQUE: Multiplanar, multisequence MR imaging of the cervical spine was performed. No intravenous contrast was administered.  COMPARISON:  Prior study  from 04/04/2017.  FINDINGS: Alignment: Straightening with reversal of the normal cervical lordosis, apex at C5-6. 3 mm degenerative anterolisthesis of C4 on C5.  Vertebrae: Vertebral body height maintained without acute or chronic fracture. Bone marrow signal intensity within normal limits. No discrete or worrisome osseous lesions. No significant abnormal marrow edema.  Cord: Normal signal and morphology.  Posterior Fossa, vertebral arteries, paraspinal tissues: Visualized brain and posterior fossa within normal limits. Craniocervical junction normal. Paraspinous and prevertebral soft tissues within normal limits. Normal intravascular flow voids seen within the vertebral arteries bilaterally.  Disc levels:  C2-C3: Negative interspace. Moderate left-sided facet arthrosis. No spinal stenosis. Foramina remain patent.  C3-C4: Central to left paracentral disc protrusion indents the ventral thecal sac (series 11, image 10). Mild spinal stenosis without frank cord impingement. Moderate left with mild right facet hypertrophy. Mild bilateral uncovertebral spurring. Resultant mild to moderate left C4 foraminal narrowing. Right neural foramen is patent.  C4-C5: Anterolisthesis. Irregular right paracentral disc osteophyte complex flattens and indents the ventral thecal sac (series 11, image 14). Secondary flattening of the ventral cord without cord signal changes. Moderate left facet hypertrophy. No more than mild spinal stenosis. Mild left C5 foraminal narrowing. Right neural foramen is patent.  C5-C6: Degenerative intervertebral disc space narrowing. Broad-based left paracentral disc osteophyte complex flattens and partially effaces the ventral thecal sac. Secondary cord flattening without cord signal changes. Mild spinal stenosis. Mild left C6 foraminal narrowing. Right neural foramen remains patent.  C6-C7: Degenerative intervertebral disc space narrowing with circumferential  disc osteophyte complex. Broad posterior component flattens and partially effaces the ventral thecal sac. Mild spinal stenosis. Minimal cord flattening without cord signal changes. Foramina remain patent.  C7-T1: Negative interspace. Moderate left-sided facet hypertrophy. No canal or foraminal stenosis.  IMPRESSION: 1. Multilevel cervical spondylosis with resultant mild diffuse spinal stenosis at  C3-4 through C6-7. 2. Multifactorial degenerative changes with resultant multilevel foraminal narrowing as above. Notable findings include mild to moderate left C4 foraminal stenosis, with mild left C5 and C6 foraminal narrowing. 3. Moderate left-sided facet hypertrophy at C2-3 through C7-T1, which could contribute to underlying neck pain.  Electronically Signed   By: Jeannine Boga M.D.   On: 08/09/2022 22:41 MR THORACIC SPINE WO CONTRAST CLINICAL DATA:  Initial evaluation for unable to walk or lift right lower extremity.  EXAM: MRI THORACIC SPINE WITHOUT CONTRAST  TECHNIQUE: Multiplanar, multisequence MR imaging of the thoracic spine was performed. No intravenous contrast was administered.  COMPARISON:  None available.  FINDINGS: Alignment: Physiologic with preservation of the normal thoracic kyphosis. No listhesis.  Vertebrae: Vertebral body height maintained without acute or chronic fracture. Bone marrow signal intensity mildly heterogeneous but overall within normal limits. No discrete or worrisome osseous lesions. Minimal discogenic reactive endplate changes noted at T5-6 through T8-9. Additional reactive endplate changes noted at L1-2. No other abnormal marrow edema.  Cord:  Normal signal and morphology.  Paraspinal and other soft tissues: Paraspinous soft tissues within normal limits. Multifocal cortical scarring noted about the visualized left kidney.  Disc levels:  T1-2:  Unremarkable.  T2-3: Unremarkable.  T3-4:  Unremarkable.  T4-5:   Unremarkable.  T5-6: Left paracentral disc protrusion indents the left ventral thecal sac (series 23, image 20). Flattening of the left ventral cord without cord signal changes. No significant spinal stenosis. Foramina remain patent.  T6-7: Right paracentral to foraminal disc protrusion indents the right ventral thecal sac (series 23, image 23). No significant spinal stenosis or cord deformity. Foramina remain patent.  T7-8: Central to right paracentral disc protrusion indents the ventral thecal sac (series 23, image 26). Mild cord flattening without cord signal changes. No significant spinal stenosis. Foramina remain patent.  T8-9: Degenerative intervertebral disc space narrowing. Broad-based right paracentral disc protrusion flattens the ventral thecal sac (series 23, image 29). No significant spinal stenosis or cord deformity. Foramina remain patent.  T9-10: Mild disc bulge. Left paracentral disc protrusion flattens the left ventral thecal sac (series 23, image 33). Minimal cord flattening without cord signal changes. No significant spinal stenosis. Foramina remain patent.  T10-11: Disc desiccation with minimal disc bulge. Endplate spurring. No stenosis.  T11-12: Disc desiccation with minimal disc bulge. Endplate spurring. No stenosis.  T12-L1: Disc desiccation with mild disc bulge. Endplate spurring. No stenosis.  IMPRESSION: 1. No acute abnormality within the thoracic spine. No high-grade spinal stenosis or frank cord compression. No cord signal changes to suggest myelopathy. 2. Multilevel degenerative spondylosis with disc protrusions at T5-6 through T9-10 as above. Mild cord flattening at several levels without significant spinal stenosis.  Electronically Signed   By: Jeannine Boga M.D.   On: 08/09/2022 22:34 US Venous Img Lower Unilateral Right CLINICAL DATA:  Right leg weakness  EXAM: RIGHT LOWER EXTREMITY VENOUS DOPPLER  ULTRASOUND  TECHNIQUE: Gray-scale sonography with compression, as well as color and duplex ultrasound, were performed to evaluate the deep venous system(s) from the level of the common femoral vein through the popliteal and proximal calf veins.  COMPARISON:  12/23/2019  FINDINGS: VENOUS  Normal compressibility of the common femoral, superficial femoral, and popliteal veins, as well as the visualized calf veins. Visualized portions of profunda femoral vein and great saphenous vein unremarkable. No filling defects to suggest DVT on grayscale or color Doppler imaging. Doppler waveforms show normal direction of venous flow, normal respiratory plasticity and response to augmentation.  Limited views  of the contralateral common femoral vein are unremarkable.  OTHER  None.  Limitations: none  IMPRESSION: Negative.  Electronically Signed   By: Rolm Baptise M.D.   On: 08/09/2022 19:18 CT Maxillofacial W Contrast CLINICAL DATA:  Right facial swelling, cellulitis/abscess/tumor.  EXAM: CT MAXILLOFACIAL WITH CONTRAST  TECHNIQUE: Multidetector CT imaging of the maxillofacial structures was performed with intravenous contrast. Multiplanar CT image reconstructions were also generated.  RADIATION DOSE REDUCTION: This exam was performed according to the departmental dose-optimization program which includes automated exposure control, adjustment of the mA and/or kV according to patient size and/or use of iterative reconstruction technique.  CONTRAST:  76m OMNIPAQUE IOHEXOL 300 MG/ML  SOLN  COMPARISON:  None Available.  FINDINGS: Osseous: No fracture or mandibular dislocation. No destructive process. Focal dental disease is evident by CT.  Orbits: Bilateral lens replacements are noted. Globes and orbits are otherwise unremarkable.  Sinuses: The paranasal sinuses and mastoid air cells are clear.  Soft tissues: No significant subcutaneous soft tissue swelling is present  over the right side of the face. No focal mass lesion is present. The visualized soft tissues are within normal limits.  Limited intracranial: Unremarkable.  IMPRESSION: 1. No acute or focal lesion to explain the patient's symptoms. 2. No significant subcutaneous soft tissue swelling over the right side of the face.  Electronically Signed   By: CSan MorelleM.D.   On: 08/09/2022 16:33 MR LUMBAR SPINE WO CONTRAST CLINICAL DATA:  Acute bilateral leg weakness since yesterday.  EXAM: MRI LUMBAR SPINE WITHOUT CONTRAST  TECHNIQUE: Multiplanar, multisequence MR imaging of the lumbar spine was performed. No intravenous contrast was administered.  COMPARISON:  CT abdomen pelvis dated August 10, 2018.  FINDINGS: Segmentation:  Standard.  Alignment: Unchanged trace retrolisthesis at L1-L2 and mild anterolisthesis at L4-L5.  Vertebrae:  No fracture, evidence of discitis, or bone lesion.  Conus medullaris and cauda equina: Conus extends to the L2 level. Conus and cauda equina appear normal.  Paraspinal and other soft tissues: Negative.  Disc levels:  T12-L1:  Mild disc bulging.  No stenosis.  L1-L2:  Mild-to-moderate disc bulging.  No stenosis.  L2-L3:  Mild disc bulging.  No stenosis.  L3-L4: Mild disc bulging and bilateral facet arthropathy. No stenosis.  L4-L5: Disc uncovering and mild disc bulging. Superimposed small left foraminal disc protrusion encroaching on the exiting left L4 nerve root. Severe bilateral facet arthropathy. Mild spinal canal stenosis. No neuroforaminal stenosis.  L5-S1: Mild disc bulging with superimposed bulky right far lateral disc osteophyte complex. Mild bilateral facet arthropathy. No stenosis.  IMPRESSION: 1. Multilevel degenerative changes of the lumbar spine as described above. Small left foraminal disc protrusion at L4-L5 encroaching on the exiting left L4 nerve root. No high-grade stenosis or impingement.  Electronically  Signed   By: WTitus DubinM.D.   On: 08/09/2022 14:08 MR BRAIN WO CONTRAST CLINICAL DATA:  Neuro deficit, acute, stroke suspected. Right-sided deficits.  EXAM: MRI HEAD WITHOUT CONTRAST  TECHNIQUE: Multiplanar, multiecho pulse sequences of the brain and surrounding structures were obtained without intravenous contrast.  COMPARISON:  Head CT August 09, 2022; MRI of the brain June 16, 2019.  FINDINGS: Brain: No acute infarction, hemorrhage, hydrocephalus, extra-axial collection or mass lesion. Remote right cerebellar infarct. A few punctate foci of T2 hyperintensity are seen within the white matter the cerebral hemispheres, nonspecific, most likely related to chronic microangiopathy.  Vascular: Normal flow voids.  Skull and upper cervical spine: Normal marrow signal.  Sinuses/Orbits: Negative.  Other: None.  IMPRESSION:  1. No acute intracranial abnormality. 2. Remote right cerebellar infarct.  Electronically Signed   By: Pedro Earls M.D.   On: 08/09/2022 13:45 CT HEAD WO CONTRAST CLINICAL DATA:  HX of TIA's  EXAM: CT HEAD WITHOUT CONTRAST  TECHNIQUE: Contiguous axial images were obtained from the base of the skull through the vertex without intravenous contrast.  RADIATION DOSE REDUCTION: This exam was performed according to the departmental dose-optimization program which includes automated exposure control, adjustment of the mA and/or kV according to patient size and/or use of iterative reconstruction technique.  COMPARISON:  06/16/2019  FINDINGS: Brain: No evidence of acute infarction, hemorrhage, hydrocephalus, extra-axial collection or mass lesion/mass effect. Encephalomalacia from a chronic right cerebellar CVA. Ventricles, sulci and cisterns prominent consistent with age-related involutional changes.  Vascular: No hyperdense vessel or unexpected calcification.  Skull: Normal. Negative for fracture or focal  lesion.  Sinuses/Orbits: No acute finding.  IMPRESSION: No acute intracranial process. Chronic right cerebellar CVA. Age-related involutional changes.  Electronically Signed   By: Sammie Bench M.D.   On: 08/09/2022 10:39   Assessment and Plan: GAEL DELUDE is a 72 y.o. 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   * Leg weakness Unclear etiology, no upper motor neuron signs on exam.  Despite lack of right-sided findings most strongly suspect peripheral nerve compression given lower motor neuron signs on exam.  No acute imaging findings to suggest CNS lesion as the cause. - Neurosurgery consultation pending, follow-up - PT/OT consultation in a.m. - Consider outpatient neurology eval pending clinical course  Known medical problems CVD-continue atorvastatin, aspirin Thyroidism-continue Synthroid Hypertension-continue lisinopril, Maxzide DM2-continue metformin, hold glipizide/Trulicity while admitted GERD-continue PPI  Facial paresthesia Also with unclear etiology, imaging negative for acute pathology intracranially.  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 Observation status is judged to be reasonable and necessary in order to provide the required intensity of service to ensure the patient's safety. The patient's presenting symptoms, physical exam findings, and initial radiographic and laboratory data in the context of their medical condition is felt to place them at decreased risk for further clinical  deterioration. Furthermore, it is anticipated that the patient will be medically stable for discharge from the hospital within 2 midnights of admission.   Author: Clarnce Flock, MD 08/10/2022 12:19 AM  For on call review [email or web address].

## 2022-08-09 NOTE — ED Provider Notes (Signed)
[facility] Provider Note    Event Date/Time   First MD Initiated Contact with Patient 08/09/22 1120     (approximate)  History   Chief Complaint: Weakness  HPI  Susan Chung is a 72 y.o. female with a past medical history of diabetes, hypertension, neck pain, presents to the emergency department for multiple symptoms.  According to the patient she has a history of vertigo but yesterday had a very significant episode of vertigo in which she felt very dizzy and off balance.  She states during this time she also developed weakness in her right leg and tingling and a sensation of numbness in the right face.  She has noticed today that the right face appears swollen somewhat compared to her left face.  She is also had what she describes right lower extremity swelling (although not appreciated on exam compared to the left lower extremity).  Patient also states yesterday she had acute onset of lower back pain.  Patient states a history of cervical spine pain for which she receives injections, but has never had lower back pain.  Denies any trauma to the back or anything exertional.  Denies any bladder or bowel incontinence.  States she feels like her right lower extremity is weak but sensation is intact.  Denies any abdominal pain or chest pain no shortness of breath.  No recent fever cough or congestion.  No urinary symptoms.  Physical Exam   Triage Vital Signs: ED Triage Vitals  Enc Vitals Group     BP 08/09/22 1010 121/72     Pulse Rate 08/09/22 1010 (!) 57     Resp 08/09/22 1010 18     Temp 08/09/22 1010 98.9 F (37.2 C)     Temp Source 08/09/22 1010 Oral     SpO2 08/09/22 1010 99 %     Weight --      Height --      Head Circumference --      Peak Flow --      Pain Score 08/09/22 1011 7     Pain Loc --      Pain Edu? --      Excl. in [place]? --     Most recent vital signs: Vitals:   08/09/22 1010  BP: 121/72  Pulse: (!) 57  Resp: 18  Temp: 98.9 F  (37.2 C)  SpO2: 99%    General: Awake, no distress.  Patient does have very slight swelling of the right face noted.  She believes this is new compared to baseline, but states she only noticed it because her husband noticed some mild swelling.  Denies any facial pain or tenderness to palpation, no sign of dental infection or abscess. CV:  Good peripheral perfusion.  Regular rate and rhythm  Resp:  Normal effort.  Equal breath sounds bilaterally.  Abd:  No distention.  Soft, nontender.  No rebound or guarding. Other:  Patient does have mild bilateral lower extremity edema, but appears to be equal.  Nontender.  Neurologic testing patient has equal grip strength but does have a slight right pronator drift.  She has diminished strength 4+/5 to the right lower extremity compared to 5/5 in the left lower extremity.  Sensation appears intact.  No obvious cranial nerve deficit.   ED Results / Procedures / Treatments   EKG  EKG viewed and interpreted by myself shows a normal sinus rhythm at 62 bpm with a narrow QRS, normal axis, normal intervals, nonspecific ST  changes.  1 PVC noted.  RADIOLOGY  I have reviewed and interpreted the CT head images.  No large hemorrhage seen on my evaluation. Radiology is read the CT scan as negative for acute infarct but chronic right cerebellar infarct.   MEDICATIONS ORDERED IN ED: Medications - No data to display   IMPRESSION / MDM / [place] / ED COURSE  I reviewed the triage vital signs and the nursing notes.  Patient's presentation is most consistent with acute presentation with potential threat to life or bodily function.  Patient presents emergency department with multiple symptoms.  Patient states she noted most of the symptoms starting yesterday including lower back pain right leg weakness, some right arm weakness and tingling in her right face.  Patient's workup so far shows a reassuring CT scan of the head.  Urinalysis is normal, CBC is  normal, chemistry is normal.  EKG shows no concerning findings.  However given the patient's symptoms and her prior infarct I believe an MRI of the brain is warranted to rule out CVA.  Given acute lower back pain and no history of back pain with right leg weakness I still believe the symptoms would likely have to start above the neck given right arm right leg and right facial involvement however given acute onset of lower back pain we will also obtain an MRI of the lumbar spine.  Denies any numbness denies any incontinence, less concern for cauda equina.  Given the patient's complaint of some right facial fullness which she believes is new the husband is not sure, it is nontender and there is no sign of dental abscess we will obtain CT with contrast of the face as a precaution.  Patient has no chest pain no abdominal pain no shortness of breath.  She has equal feeling pulses in all 4 extremities.  Patient has a history of vertigo but felt like the vertigo was worse yesterday.  CT scan does show a remote cerebellar infarct which could be causing her vertigo symptoms.  Patient's MRI of the brain is negative for acute infarct but does show a remote cerebellar infarct which could explain the patient's intermittent dizziness.  Patient MRI of the lumbar spine does show mild bulging disks but does not appear to show any significant finding that explain the patient's right leg weakness.  Daughter states the patient is normally ambulatory without any assistance, but since this morning has had progressive weakness now to the point where she is unable to walk due to right leg weakness.  I do not see anything on the MRI that would necessarily explain the patient's right leg weakness.  Patient has a history of cervical spine issues in the past given that the dizziness could be related to the patient's prior infarct the lesion could possibly be located in the cervical or T-spine that could cause leg symptoms although the  patient denies any arm symptoms.  Given the patient's complaint of subjective swelling and weakness of the leg will also obtain an ultrasound to rule out DVT.  Ultrasound of the right lower extremity is negative for DVT.  COVID/flu/RSV is negative as well.  MRIs are pending.  Patient care signed out to oncoming provider. FINAL CLINICAL IMPRESSION(S) / ED DIAGNOSES   Dizziness Right lower extremity weakness   Note:  This document was prepared using Dragon voice recognition software and may include unintentional dictation errors.   Harvest Dark, MD 08/09/22 Joen Laura

## 2022-08-10 DIAGNOSIS — Z8673 Personal history of transient ischemic attack (TIA), and cerebral infarction without residual deficits: Secondary | ICD-10-CM | POA: Diagnosis not present

## 2022-08-10 DIAGNOSIS — Z789 Other specified health status: Secondary | ICD-10-CM

## 2022-08-10 DIAGNOSIS — E119 Type 2 diabetes mellitus without complications: Secondary | ICD-10-CM

## 2022-08-10 DIAGNOSIS — R531 Weakness: Secondary | ICD-10-CM | POA: Diagnosis not present

## 2022-08-10 DIAGNOSIS — K56600 Partial intestinal obstruction, unspecified as to cause: Secondary | ICD-10-CM

## 2022-08-10 DIAGNOSIS — R202 Paresthesia of skin: Secondary | ICD-10-CM | POA: Diagnosis not present

## 2022-08-10 DIAGNOSIS — E039 Hypothyroidism, unspecified: Secondary | ICD-10-CM

## 2022-08-10 DIAGNOSIS — R29898 Other symptoms and signs involving the musculoskeletal system: Secondary | ICD-10-CM | POA: Diagnosis not present

## 2022-08-10 LAB — CBG MONITORING, ED
Glucose-Capillary: 234 mg/dL — ABNORMAL HIGH (ref 70–99)
Glucose-Capillary: 227 mg/dL — ABNORMAL HIGH (ref 70–99)
Glucose-Capillary: 251 mg/dL — ABNORMAL HIGH (ref 70–99)

## 2022-08-10 LAB — HEMOGLOBIN A1C
Hgb A1c MFr Bld: 6.8 % — ABNORMAL HIGH (ref 4.8–5.6)
Mean Plasma Glucose: 148.46 mg/dL

## 2022-08-10 MED ORDER — INSULIN ASPART 100 UNIT/ML IJ SOLN: 0.0000 [IU] | Freq: Every day | INTRAMUSCULAR | Status: DC

## 2022-08-10 MED ORDER — LISINOPRIL 5 MG PO TABS
2.5000 mg | ORAL_TABLET | Freq: Every day | ORAL | Status: DC
  Filled 2022-08-10: qty 1

## 2022-08-10 MED ORDER — GABAPENTIN 100 MG PO CAPS: 200.0000 mg | ORAL_CAPSULE | Freq: Every day | ORAL | 0 refills | Status: DC

## 2022-08-10 MED ORDER — ACETAMINOPHEN 650 MG RE SUPP: 650.0000 mg | Freq: Four times a day (QID) | RECTAL | Status: DC | PRN

## 2022-08-10 MED ORDER — ONDANSETRON HCL 4 MG/2ML IJ SOLN: 4.0000 mg | Freq: Four times a day (QID) | INTRAMUSCULAR | Status: DC | PRN

## 2022-08-10 MED ORDER — PANTOPRAZOLE SODIUM 40 MG PO TBEC
40.0000 mg | DELAYED_RELEASE_TABLET | Freq: Every day | ORAL | Status: DC
  Administered 2022-08-10: 40 mg via ORAL
  Filled 2022-08-10: qty 1

## 2022-08-10 MED ORDER — METFORMIN HCL ER 500 MG PO TB24: 1000.0000 mg | ORAL_TABLET | Freq: Every day | ORAL | Status: DC

## 2022-08-10 MED ORDER — ASPIRIN 81 MG PO TBEC
81.0000 mg | DELAYED_RELEASE_TABLET | Freq: Every day | ORAL | Status: DC
  Administered 2022-08-10: 81 mg via ORAL
  Filled 2022-08-10: qty 1

## 2022-08-10 MED ORDER — TRAZODONE HCL 50 MG PO TABS: 50.0000 mg | ORAL_TABLET | Freq: Every evening | ORAL | Status: DC | PRN

## 2022-08-10 MED ORDER — TRIAMTERENE-HCTZ 37.5-25 MG PO TABS
1.0000 | ORAL_TABLET | Freq: Every day | ORAL | Status: DC
  Administered 2022-08-10: 1 via ORAL
  Filled 2022-08-10: qty 1

## 2022-08-10 MED ORDER — OXYCODONE HCL 5 MG PO TABS: 5.0000 mg | ORAL_TABLET | ORAL | Status: DC | PRN

## 2022-08-10 MED ORDER — INSULIN GLARGINE-YFGN 100 UNIT/ML ~~LOC~~ SOLN
15.0000 [IU] | Freq: Every day | SUBCUTANEOUS | Status: DC
  Administered 2022-08-10: 15 [IU] via SUBCUTANEOUS
  Filled 2022-08-10: qty 0.15

## 2022-08-10 MED ORDER — METHOCARBAMOL 1000 MG/10ML IJ SOLN: 500.0000 mg | Freq: Four times a day (QID) | INTRAVENOUS | Status: DC | PRN

## 2022-08-10 MED ORDER — ATORVASTATIN CALCIUM 20 MG PO TABS
10.0000 mg | ORAL_TABLET | Freq: Every day | ORAL | Status: DC
  Administered 2022-08-10: 10 mg via ORAL
  Filled 2022-08-10: qty 1

## 2022-08-10 MED ORDER — INSULIN ASPART 100 UNIT/ML IJ SOLN
0.0000 [IU] | Freq: Three times a day (TID) | INTRAMUSCULAR | Status: DC
  Administered 2022-08-10: 5 [IU] via SUBCUTANEOUS
  Filled 2022-08-10: qty 1

## 2022-08-10 MED ORDER — POLYETHYLENE GLYCOL 3350 17 G PO PACK: 17.0000 g | PACK | Freq: Every day | ORAL | Status: DC | PRN

## 2022-08-10 MED ORDER — LEVOTHYROXINE SODIUM 125 MCG PO TABS
125.0000 ug | ORAL_TABLET | Freq: Every day | ORAL | Status: DC
  Administered 2022-08-10: 125 ug via ORAL
  Filled 2022-08-10: qty 1

## 2022-08-10 MED ORDER — ONDANSETRON HCL 4 MG PO TABS: 4.0000 mg | ORAL_TABLET | Freq: Four times a day (QID) | ORAL | Status: DC | PRN

## 2022-08-10 MED ORDER — ACETAMINOPHEN 325 MG PO TABS
650.0000 mg | ORAL_TABLET | Freq: Four times a day (QID) | ORAL | Status: DC | PRN
  Administered 2022-08-10 (×2): 650 mg via ORAL
  Filled 2022-08-10 (×2): qty 2

## 2022-08-10 NOTE — Discharge Summary (Signed)
Physician Discharge Summary  ESTEPHANIA LICCIARDI DXA:[id number] DOB: [birth year] DOA: 08/09/2022  PCP: Adin Hector, MD  Admit date: 08/09/2022 Discharge date: 08/10/2022 Admitted From: Home Disposition: Home Recommendations for Outpatient Follow-up:  Follow up with PCP, neurology and neurosurgery as below Please follow up on the following pending results: None  Home Health: PT/OT Equipment/Devices: Wheelchair  Discharge Condition: Stable CODE STATUS:/DNR  Follow-up Information     Tama High III, MD. Schedule an appointment as soon as possible for a visit in 1 week(s).   Specialty: Internal Medicine Contact information: [address] [facility] [place] [address] [place]. Schedule an appointment as soon as possible for a visit in 2 week(s).   Contact information: [place], [place], [place]. Schedule an appointment as soon as possible for a visit in 2 week(s).   Specialty: Neurosurgery Contact information: [place] [id number] [facility] course 72 year old F with PMH of remote CVA with residual vertigo, DM-2 and hypothyroidism presenting with RLE weakness, right facial paresthesia and increased vertigo for 1 day.  In ED, stable vitals.  CBC, BMP and respiratory viral panel without acute finding.  EKG without acute ischemic changes.  CT head, MRI brain and CT maxillofacial without acute finding.  MRI of cervical, thoracic and lumbar spine showed no red flags but diffuse age-related degenerative changes with disc bulge at L4-5 on the left side with L4 encroachment.  RLE venous Doppler negative for DVT.  Patient was given Solu-Medrol injection.  Neurosurgery consulted  The next day, patient's symptoms improved tremendously but not quite back to baseline from RLE  weakness standpoint.  Neurosurgery recommended neurology evaluation.  Discussed with neurology, Dr. Lorraine Lax who recommended outpatient follow-up.  After discussion with family, we have decided to try low-dose gabapentin 200 mg at night.  Patient to follow-up with neurosurgery and neurology outpatient.   Home health PT/OT and wheelchair ordered as recommended by therapy.  See individual problem list below for more.   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  Nontoxic. HEENT: MMM.  Vision and hearing grossly intact.  NECK: Supple.  No apparent JVD.  RESP:  No IWOB.  Fair aeration bilaterally. CVS:  RRR. Heart sounds normal.  ABD/GI/GU: BS+. Abd soft, NTND.  MSK/EXT:  Moves extremities. No apparent deformity. No edema.  SKIN: no apparent skin lesion or wound NEURO: Awake and alert. Oriented appropriately.  PERRL.  No facial droop.  Speech clear.  Motor 4/5 with right hip flexion.  5/5 elsewhere.  Patellar reflex 2+ bilaterally.  Achilles reflex 0 bilaterally.  Light sensation intact in all dermatomes. PSYCH: Calm. Normal affect.   Discharge Instructions Discharge Instructions     Diet - low sodium heart healthy   Complete by: As directed    Diet Carb Modified   Complete by: As directed    Discharge instructions   Complete by: As directed    It has been a pleasure taking care of you!  You  were hospitalized due to right leg weakness, face numbness and increased vertigo.  Unclear what caused the symptoms but your symptoms improved.  MRI of your brain did not show stroke.  You might have a little bit of nerve impingement that could potentially contribute to your leg weakness.  We have started you on low-dose gabapentin that you may try at night.  Follow-up with neurology and neurosurgery outpatient.   Take care,   Increase activity slowly   Complete by: As directed       Allergies as of 08/10/2022       Reactions   Sulfa Antibiotics Rash        Medication List     STOP taking these medications    amoxicillin-clavulanate 875-125 MG tablet Commonly known as: AUGMENTIN       TAKE these medications    acetaminophen 650 MG CR tablet Commonly known as: TYLENOL Take 1,300 mg by mouth every 8 (eight) hours as needed for pain.   aspirin EC 81 MG tablet Take 81 mg by mouth daily.   atorvastatin 20 MG tablet Commonly known as: LIPITOR Take 10 mg by mouth daily.   gabapentin 100 MG capsule Commonly known as: NEURONTIN Take 2 capsules (200 mg total) by mouth at bedtime.   glipiZIDE 10 MG tablet Commonly known as: GLUCOTROL Take 10 mg by mouth daily before breakfast.   levothyroxine 125 MCG tablet Commonly known as: SYNTHROID Take 125 mcg by mouth daily before breakfast.   lisinopril 2.5 MG tablet Commonly known as: ZESTRIL Take 2.5 mg by mouth daily.   metFORMIN 500 MG 24 hr tablet Commonly known as: GLUCOPHAGE-XR Take 1,000 mg by mouth daily with supper.   omeprazole 20 MG capsule Commonly known as: PRILOSEC Take 20 mg by mouth daily.   triamterene-hydrochlorothiazide 37.5-25 MG tablet Commonly known as: MAXZIDE-25 Take 1 tablet by mouth daily.   Trulicity 6.81 EX/5.1ZG Sopn Generic drug: Dulaglutide Inject 0.75 mg into the skin once a week.               Durable Medical Equipment  (From admission, onward)           Start      Ordered   08/10/22 1119  For home use only DME lightweight manual wheelchair with seat cushion  Once       Comments: Patient suffers from R LE weakness which impairs their ability to perform daily activities like bathing and toileting in the home.  A walker will not resolve  issue with performing activities of daily living. A wheelchair will allow patient to safely perform daily activities. Patient is not able to propel themselves in the home using a standard weight wheelchair due to general weakness. Patient can self propel in the lightweight wheelchair. Length of need 6 months . Accessories: standard foot rests, wheel locks, extensions and anti-tippers.   08/10/22 1119            Consultations: Neurosurgery Neurology  Procedures/Studies:   MR Cervical Spine Wo Contrast  Result Date: 08/09/2022 CLINICAL DATA:  Initial  evaluation for neck pain, cervical radiculopathy. EXAM: MRI CERVICAL SPINE WITHOUT CONTRAST TECHNIQUE: Multiplanar, multisequence MR imaging of the cervical spine was performed. No intravenous contrast was administered. COMPARISON:  Prior study from 04/04/2017. FINDINGS: Alignment: Straightening with reversal of the normal cervical lordosis, apex at C5-6. 3 mm degenerative anterolisthesis of C4 on C5. Vertebrae: Vertebral body height maintained without acute or chronic fracture. Bone marrow signal intensity within normal limits. No discrete or worrisome osseous lesions. No significant abnormal marrow edema. Cord: Normal signal and morphology. Posterior Fossa, vertebral arteries, paraspinal tissues: Visualized brain and posterior fossa within normal limits. Craniocervical junction normal. Paraspinous and prevertebral soft tissues within normal limits. Normal intravascular flow voids seen within the vertebral arteries bilaterally. Disc levels: C2-C3: Negative interspace. Moderate left-sided facet arthrosis. No spinal stenosis. Foramina remain patent. C3-C4: Central to left  paracentral disc protrusion indents the ventral thecal sac (series 11, image 10). Mild spinal stenosis without frank cord impingement. Moderate left with mild right facet hypertrophy. Mild bilateral uncovertebral spurring. Resultant mild to moderate left C4 foraminal narrowing. Right neural foramen is patent. C4-C5: Anterolisthesis. Irregular right paracentral disc osteophyte complex flattens and indents the ventral thecal sac (series 11, image 14). Secondary flattening of the ventral cord without cord signal changes. Moderate left facet hypertrophy. No more than mild spinal stenosis. Mild left C5 foraminal narrowing. Right neural foramen is patent. C5-C6: Degenerative intervertebral disc space narrowing. Broad-based left paracentral disc osteophyte complex flattens and partially effaces the ventral thecal sac. Secondary cord flattening without cord signal changes. Mild spinal stenosis. Mild left C6 foraminal narrowing. Right neural foramen remains patent. C6-C7: Degenerative intervertebral disc space narrowing with circumferential disc osteophyte complex. Broad posterior component flattens and partially effaces the ventral thecal sac. Mild spinal stenosis. Minimal cord flattening without cord signal changes. Foramina remain patent. C7-T1: Negative interspace. Moderate left-sided facet hypertrophy. No canal or foraminal stenosis. IMPRESSION: 1. Multilevel cervical spondylosis with resultant mild diffuse spinal stenosis at C3-4 through C6-7. 2. Multifactorial degenerative changes with resultant multilevel foraminal narrowing as above. Notable findings include mild to moderate left C4 foraminal stenosis, with mild left C5 and C6 foraminal narrowing. 3. Moderate left-sided facet hypertrophy at C2-3 through C7-T1, which could contribute to underlying neck pain. Electronically Signed   By: Jeannine Boga M.D.   On: 08/09/2022 22:41   MR THORACIC SPINE WO CONTRAST  Result Date: 08/09/2022 CLINICAL DATA:  Initial  evaluation for unable to walk or lift right lower extremity. EXAM: MRI THORACIC SPINE WITHOUT CONTRAST TECHNIQUE: Multiplanar, multisequence MR imaging of the thoracic spine was performed. No intravenous contrast was administered. COMPARISON:  None available. FINDINGS: Alignment: Physiologic with preservation of the normal thoracic kyphosis. No listhesis. Vertebrae: Vertebral body height maintained without acute or chronic fracture. Bone marrow signal intensity mildly heterogeneous but overall within normal limits. No discrete or worrisome osseous lesions. Minimal discogenic reactive endplate changes noted at T5-6 through T8-9. Additional reactive endplate changes noted at L1-2. No other abnormal marrow edema. Cord:  Normal signal and morphology. Paraspinal and other soft tissues: Paraspinous soft tissues within normal limits. Multifocal cortical scarring noted about the visualized left kidney. Disc levels: T1-2:  Unremarkable. T2-3: Unremarkable. T3-4:  Unremarkable. T4-5:  Unremarkable. T5-6: Left paracentral disc protrusion indents the left ventral thecal sac (series 23, image 20). Flattening of the left ventral cord without cord signal changes. No significant spinal stenosis. Foramina remain patent. T6-7: Right paracentral to foraminal disc protrusion indents the right ventral thecal sac (series 23, image 23). No significant spinal  stenosis or cord deformity. Foramina remain patent. T7-8: Central to right paracentral disc protrusion indents the ventral thecal sac (series 23, image 26). Mild cord flattening without cord signal changes. No significant spinal stenosis. Foramina remain patent. T8-9: Degenerative intervertebral disc space narrowing. Broad-based right paracentral disc protrusion flattens the ventral thecal sac (series 23, image 29). No significant spinal stenosis or cord deformity. Foramina remain patent. T9-10: Mild disc bulge. Left paracentral disc protrusion flattens the left ventral thecal sac  (series 23, image 33). Minimal cord flattening without cord signal changes. No significant spinal stenosis. Foramina remain patent. T10-11: Disc desiccation with minimal disc bulge. Endplate spurring. No stenosis. T11-12: Disc desiccation with minimal disc bulge. Endplate spurring. No stenosis. T12-L1: Disc desiccation with mild disc bulge. Endplate spurring. No stenosis. IMPRESSION: 1. No acute abnormality within the thoracic spine. No high-grade spinal stenosis or frank cord compression. No cord signal changes to suggest myelopathy. 2. Multilevel degenerative spondylosis with disc protrusions at T5-6 through T9-10 as above. Mild cord flattening at several levels without significant spinal stenosis. Electronically Signed   By: Jeannine Boga M.D.   On: 08/09/2022 22:34   US Venous Img Lower Unilateral Right  Result Date: 08/09/2022 CLINICAL DATA:  Right leg weakness EXAM: RIGHT LOWER EXTREMITY VENOUS DOPPLER ULTRASOUND TECHNIQUE: Gray-scale sonography with compression, as well as color and duplex ultrasound, were performed to evaluate the deep venous system(s) from the level of the common femoral vein through the popliteal and proximal calf veins. COMPARISON:  12/23/2019 FINDINGS: VENOUS Normal compressibility of the common femoral, superficial femoral, and popliteal veins, as well as the visualized calf veins. Visualized portions of profunda femoral vein and great saphenous vein unremarkable. No filling defects to suggest DVT on grayscale or color Doppler imaging. Doppler waveforms show normal direction of venous flow, normal respiratory plasticity and response to augmentation. Limited views of the contralateral common femoral vein are unremarkable. OTHER None. Limitations: none IMPRESSION: Negative. Electronically Signed   By: Rolm Baptise M.D.   On: 08/09/2022 19:18   CT Maxillofacial W Contrast  Result Date: 08/09/2022 CLINICAL DATA:  Right facial swelling, cellulitis/abscess/tumor. EXAM: CT  MAXILLOFACIAL WITH CONTRAST TECHNIQUE: Multidetector CT imaging of the maxillofacial structures was performed with intravenous contrast. Multiplanar CT image reconstructions were also generated. RADIATION DOSE REDUCTION: This exam was performed according to the departmental dose-optimization program which includes automated exposure control, adjustment of the mA and/or kV according to patient size and/or use of iterative reconstruction technique. CONTRAST:  33m OMNIPAQUE IOHEXOL 300 MG/ML  SOLN COMPARISON:  None Available. FINDINGS: Osseous: No fracture or mandibular dislocation. No destructive process. Focal dental disease is evident by CT. Orbits: Bilateral lens replacements are noted. Globes and orbits are otherwise unremarkable. Sinuses: The paranasal sinuses and mastoid air cells are clear. Soft tissues: No significant subcutaneous soft tissue swelling is present over the right side of the face. No focal mass lesion is present. The visualized soft tissues are within normal limits. Limited intracranial: Unremarkable. IMPRESSION: 1. No acute or focal lesion to explain the patient's symptoms. 2. No significant subcutaneous soft tissue swelling over the right side of the face. Electronically Signed   By: CSan MorelleM.D.   On: 08/09/2022 16:33   MR LUMBAR SPINE WO CONTRAST  Result Date: 08/09/2022 CLINICAL DATA:  Acute bilateral leg weakness since yesterday. EXAM: MRI LUMBAR SPINE WITHOUT CONTRAST TECHNIQUE: Multiplanar, multisequence MR imaging of the lumbar spine was performed. No intravenous contrast was administered. COMPARISON:  CT abdomen pelvis dated August 10, 2018. FINDINGS:  Segmentation:  Standard. Alignment: Unchanged trace retrolisthesis at L1-L2 and mild anterolisthesis at L4-L5. Vertebrae:  No fracture, evidence of discitis, or bone lesion. Conus medullaris and cauda equina: Conus extends to the L2 level. Conus and cauda equina appear normal. Paraspinal and other soft tissues: Negative.  Disc levels: T12-L1:  Mild disc bulging.  No stenosis. L1-L2:  Mild-to-moderate disc bulging.  No stenosis. L2-L3:  Mild disc bulging.  No stenosis. L3-L4: Mild disc bulging and bilateral facet arthropathy. No stenosis. L4-L5: Disc uncovering and mild disc bulging. Superimposed small left foraminal disc protrusion encroaching on the exiting left L4 nerve root. Severe bilateral facet arthropathy. Mild spinal canal stenosis. No neuroforaminal stenosis. L5-S1: Mild disc bulging with superimposed bulky right far lateral disc osteophyte complex. Mild bilateral facet arthropathy. No stenosis. IMPRESSION: 1. Multilevel degenerative changes of the lumbar spine as described above. Small left foraminal disc protrusion at L4-L5 encroaching on the exiting left L4 nerve root. No high-grade stenosis or impingement. Electronically Signed   By: Titus Dubin M.D.   On: 08/09/2022 14:08   MR BRAIN WO CONTRAST  Result Date: 08/09/2022 CLINICAL DATA:  Neuro deficit, acute, stroke suspected. Right-sided deficits. EXAM: MRI HEAD WITHOUT CONTRAST TECHNIQUE: Multiplanar, multiecho pulse sequences of the brain and surrounding structures were obtained without intravenous contrast. COMPARISON:  Head CT August 09, 2022; MRI of the brain June 16, 2019. FINDINGS: Brain: No acute infarction, hemorrhage, hydrocephalus, extra-axial collection or mass lesion. Remote right cerebellar infarct. A few punctate foci of T2 hyperintensity are seen within the white matter the cerebral hemispheres, nonspecific, most likely related to chronic microangiopathy. Vascular: Normal flow voids. Skull and upper cervical spine: Normal marrow signal. Sinuses/Orbits: Negative. Other: None. IMPRESSION: 1. No acute intracranial abnormality. 2. Remote right cerebellar infarct. Electronically Signed   By: Pedro Earls M.D.   On: 08/09/2022 13:45   CT HEAD WO CONTRAST  Result Date: 08/09/2022 CLINICAL DATA:  HX of TIA's EXAM: CT HEAD WITHOUT  CONTRAST TECHNIQUE: Contiguous axial images were obtained from the base of the skull through the vertex without intravenous contrast. RADIATION DOSE REDUCTION: This exam was performed according to the departmental dose-optimization program which includes automated exposure control, adjustment of the mA and/or kV according to patient size and/or use of iterative reconstruction technique. COMPARISON:  06/16/2019 FINDINGS: Brain: No evidence of acute infarction, hemorrhage, hydrocephalus, extra-axial collection or mass lesion/mass effect. Encephalomalacia from a chronic right cerebellar CVA. Ventricles, sulci and cisterns prominent consistent with age-related involutional changes. Vascular: No hyperdense vessel or unexpected calcification. Skull: Normal. Negative for fracture or focal lesion. Sinuses/Orbits: No acute finding. IMPRESSION: No acute intracranial process. Chronic right cerebellar CVA. Age-related involutional changes. Electronically Signed   By: Sammie Bench M.D.   On: 08/09/2022 10:39       The results of significant diagnostics from this hospitalization (including imaging, microbiology, ancillary and laboratory) are listed below for reference.     Microbiology: Recent Results (from the past 240 hour(s))  Resp panel by RT-PCR (RSV, Flu A&B, Covid) Anterior Nasal Swab     Status: None   Collection Time: 08/09/22  6:18 PM   Specimen: Anterior Nasal Swab  Result Value Ref Range Status   SARS Coronavirus 2 by RT PCR NEGATIVE NEGATIVE Final    Comment: (NOTE) SARS-CoV-2 target nucleic acids are NOT DETECTED.  The SARS-CoV-2 RNA is generally detectable in upper respiratory specimens during the acute phase of infection. The lowest concentration of SARS-CoV-2 viral copies this assay can detect is 138  copies/mL. A negative result does not preclude SARS-Cov-2 infection and should not be used as the sole basis for treatment or other patient management decisions. A negative result may occur  with  improper specimen collection/handling, submission of specimen other than nasopharyngeal swab, presence of viral mutation(s) within the areas targeted by this assay, and inadequate number of viral copies(<138 copies/mL). A negative result must be combined with clinical observations, patient history, and epidemiological information. The expected result is Negative.  Fact Sheet for Patients:  EntrepreneurPulse.com.au  Fact Sheet for Healthcare Providers:  IncredibleEmployment.be  This test is no t yet approved or cleared by the [place] FDA and  has been authorized for detection and/or diagnosis of SARS-CoV-2 by FDA under an Emergency Use Authorization (EUA). This EUA will remain  in effect (meaning this test can be used) for the duration of the COVID-19 declaration under Section 564(b)(1) of the Act, 21 U.S.C.section 360bbb-3(b)(1), unless the authorization is terminated  or revoked sooner.       Influenza A by PCR NEGATIVE NEGATIVE Final   Influenza B by PCR NEGATIVE NEGATIVE Final    Comment: (NOTE) The Xpert Xpress SARS-CoV-2/FLU/RSV plus assay is intended as an aid in the diagnosis of influenza from Nasopharyngeal swab specimens and should not be used as a sole basis for treatment. Nasal washings and aspirates are unacceptable for Xpert Xpress SARS-CoV-2/FLU/RSV testing.  Fact Sheet for Patients: EntrepreneurPulse.com.au  Fact Sheet for Healthcare Providers: IncredibleEmployment.be  This test is not yet approved or cleared by the [place] FDA and has been authorized for detection and/or diagnosis of SARS-CoV-2 by FDA under an Emergency Use Authorization (EUA). This EUA will remain in effect (meaning this test can be used) for the duration of the COVID-19 declaration under Section 564(b)(1) of the Act, 21 U.S.C. section 360bbb-3(b)(1), unless the authorization is terminated  or revoked.     Resp Syncytial Virus by PCR NEGATIVE NEGATIVE Final    Comment: (NOTE) Fact Sheet for Patients: EntrepreneurPulse.com.au  Fact Sheet for Healthcare Providers: IncredibleEmployment.be  This test is not yet approved or cleared by the [place] FDA and has been authorized for detection and/or diagnosis of SARS-CoV-2 by FDA under an Emergency Use Authorization (EUA). This EUA will remain in effect (meaning this test can be used) for the duration of the COVID-19 declaration under Section 564(b)(1) of the Act, 21 U.S.C. section 360bbb-3(b)(1), unless the authorization is terminated or revoked.  Performed at [facility], [address] [place], [place] [address]      Labs:  CBC: Recent Labs  Lab 08/09/22 1023  WBC 9.9  HGB 13.9  HCT 43.8  MCV 91.6  PLT 219   BMP &GFR Recent Labs  Lab 08/09/22 1023  NA 135  K 4.3  CL 101  CO2 24  GLUCOSE 127*  BUN 15  CREATININE 0.63  CALCIUM 9.3   CrCl cannot be calculated (Unknown ideal weight.). Liver & Pancreas: No results for input(s): "AST", "ALT", "ALKPHOS", "BILITOT", "PROT", "ALBUMIN" in the last 168 hours. No results for input(s): "LIPASE", "AMYLASE" in the last 168 hours. No results for input(s): "AMMONIA" in the last 168 hours. Diabetic: Recent Labs    08/09/22 1023  HGBA1C 6.8*   Recent Labs  Lab 08/10/22 0727 08/10/22 1212 08/10/22 1642  GLUCAP 234* 227* 251*   Cardiac Enzymes: No results for input(s): "CKTOTAL", "CKMB", "CKMBINDEX", "TROPONINI" in the last 168 hours. No results for input(s): "PROBNP" in the last 8760 hours. Coagulation Profile: No results for input(s): "  INR", "PROTIME" in the last 168 hours. Thyroid Function Tests: No results for input(s): "TSH", "T4TOTAL", "FREET4", "T3FREE", "THYROIDAB" in the last 72 hours. Lipid Profile: No results for input(s): "CHOL", "HDL", "LDLCALC", "TRIG", "CHOLHDL", "LDLDIRECT" in the last  72 hours. Anemia Panel: No results for input(s): "VITAMINB12", "FOLATE", "FERRITIN", "TIBC", "IRON", "RETICCTPCT" in the last 72 hours. Urine analysis:    Component Value Date/Time   COLORURINE STRAW (A) 08/09/2022 1012   APPEARANCEUR CLEAR (A) 08/09/2022 1012   APPEARANCEUR Clear 06/12/2014 0408   LABSPEC 1.009 08/09/2022 1012   LABSPEC 1.027 06/12/2014 0408   PHURINE 7.0 08/09/2022 1012   GLUCOSEU NEGATIVE 08/09/2022 1012   GLUCOSEU Negative 06/12/2014 0408   HGBUR NEGATIVE 08/09/2022 1012   BILIRUBINUR NEGATIVE 08/09/2022 1012   BILIRUBINUR Negative 06/12/2014 0408   KETONESUR NEGATIVE 08/09/2022 1012   PROTEINUR NEGATIVE 08/09/2022 1012   NITRITE NEGATIVE 08/09/2022 1012   LEUKOCYTESUR NEGATIVE 08/09/2022 1012   LEUKOCYTESUR Negative 06/12/2014 0408   Sepsis Labs: Invalid input(s): "PROCALCITONIN", "LACTICIDVEN"   SIGNED:  Mercy Riding, MD  [facility] 08/10/2022, 6:28 PM

## 2022-08-10 NOTE — Progress Notes (Signed)
Mobility Specialist - Progress Note    08/10/22 1443  Mobility  Activity Ambulated with assistance in room;Stood at bedside;Transferred from bed to chair  Level of Assistance Contact guard assist, steadying assist  Assistive Device Front wheel walker  Distance Ambulated (ft) 4 ft  Activity Response Tolerated well  Mobility Referral Yes  $Mobility charge 1 Mobility   Pt resting in bed on RA upon entry. Pt STS and ambulates (4 step-pivot transfer) to wheelchair CGA to be transferred to the rehab gym to conduct step training. Pt returned to bed post stair training and left with needs in reach and family present.   Loma Sender Mobility Specialist 08/10/22, 3:13 PM

## 2022-08-10 NOTE — ED Notes (Signed)
Dr Tamala Julian at bedside with patient and family

## 2022-08-10 NOTE — Assessment & Plan Note (Signed)
Chronic, currently at baseline

## 2022-08-10 NOTE — Assessment & Plan Note (Signed)
Also with unclear etiology, imaging negative for acute pathology intracranially.

## 2022-08-10 NOTE — Evaluation (Signed)
Occupational Therapy Evaluation Patient Details Name: Susan Chung MRN: [id number] DOB: [birth year] Today's Date: 08/10/2022   History of Present Illness presented to ER secondary to acute onset of R LE weakness, R facial numbness after vertiginous episode; admitted for TIA/CVA work up (imaging unremarkable for acute stroke) and additional medical work up to determine etiology of R LE weakness (CNS versus peripheral cause?).  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   Clinical Impression   Patient presenting with decreased Ind in self care,balance, functional mobility/transfers, endurance, and safety awareness. Patient reports living at home with husband and being very independent and active within the community. She recently retired. Pt is caregiver for husband assisting him with IADLs and supervision for safety as needed.  Patient will have family support and assist at discharge. Pt currently functioning at min A for short distance ambulation and transfer to [facility] with use of RW. Pt able to void while seated and performs hygiene while standing with min A for balance. Min A to don disposable brief during session. Pt has access to recommended RW and BSC for home use.  Patient will benefit from acute OT to increase overall independence in the areas of ADLs, functional mobility, and safety awareness in order to safely discharge home with family support.     Recommendations for follow up therapy are one component of a multi-disciplinary discharge planning process, led by the attending physician.  Recommendations may be updated based on patient status, additional functional criteria and insurance authorization.   Follow Up Recommendations  Home health OT     Assistance Recommended at Discharge Intermittent Supervision/Assistance  Patient can return home with the following A little help with  walking and/or transfers;A little help with bathing/dressing/bathroom;Help with stairs or ramp for entrance;Assist for transportation;Assistance with cooking/housework    Functional Status Assessment  Patient has had a recent decline in their functional status and demonstrates the ability to make significant improvements in function in a reasonable and predictable amount of time.  Equipment Recommendations  Other (comment) (family is able to provided needed equipment of BSC and RW)       Precautions / Restrictions Precautions Precautions: Fall Restrictions Weight Bearing Restrictions: No      Mobility Bed Mobility               General bed mobility comments: seated edge of bed beginning/end of treatment session; family at bedside    Transfers Overall transfer level: Needs assistance Equipment used: Rolling walker (2 wheels) Transfers: Sit to/from Stand, Bed to chair/wheelchair/BSC Sit to Stand: Min assist     Step pivot transfers: Min assist            Balance Overall balance assessment: Needs assistance Sitting-balance support: No upper extremity supported, Feet supported       Standing balance support: Bilateral upper extremity supported Standing balance-Leahy Scale: Fair                             ADL either performed or assessed with clinical judgement   ADL Overall ADL's : Needs assistance/impaired                     Lower Body Dressing: Minimal assistance;Sit to/from stand   Toilet Transfer: Minimal assistance;BSC/3in1;Rolling walker (2 wheels)   Toileting- Clothing Manipulation and Hygiene: Minimal assistance;Sit to/from stand  Functional mobility during ADLs: Minimal assistance;Rolling walker (2 wheels)       Vision Patient Visual Report: No change from baseline              Pertinent Vitals/Pain Pain Assessment Pain Assessment: No/denies pain     Hand Dominance Right   Extremity/Trunk Assessment Upper  Extremity Assessment Upper Extremity Assessment: Overall WFL for tasks assessed   Lower Extremity Assessment Lower Extremity Assessment: Overall WFL for tasks assessed       Communication Communication Communication: No difficulties   Cognition Arousal/Alertness: Awake/alert Behavior During Therapy: WFL for tasks assessed/performed Overall Cognitive Status: Within Functional Limits for tasks assessed                                                  Home Living Family/patient expects to be discharged to:: Private residence Living Arrangements: Spouse/significant other Available Help at Discharge: Family Type of Home: House Home Access: Stairs to enter Technical brewer of Steps: 3 Entrance Stairs-Rails: Left Home Layout: One level     Bathroom Shower/Tub: Occupational psychologist: Handicapped height Bathroom Accessibility: Yes   Home Equipment:  (has access to RW, BSC from family; husband has WC. Patient/family considering transport chair)   Additional Comments: Pt has access to RW and BSC from family/friends      Prior Functioning/Environment Prior Level of Function : Independent/Modified Independent             Mobility Comments: Indep with ADLs, household and community mobilization; physically active at baseline.  Denies fall history.          OT Problem List: Decreased strength;Decreased activity tolerance;Decreased safety awareness;Impaired balance (sitting and/or standing);Decreased knowledge of use of DME or AE      OT Treatment/Interventions: Self-care/ADL training;Therapeutic exercise;Therapeutic activities;Energy conservation;Visual/perceptual remediation/compensation;DME and/or AE instruction;Patient/family education;Balance training    OT Goals(Current goals can be found in the care plan section) Acute Rehab OT Goals Patient Stated Goal: to get better and return home OT Goal Formulation: With patient/family Time  For Goal Achievement: 08/24/22 Potential to Achieve Goals: Good ADL Goals Pt Will Perform Grooming: with modified independence;standing Pt Will Perform Lower Body Dressing: with modified independence;sit to/from stand Pt Will Transfer to Toilet: with modified independence;ambulating Pt Will Perform Toileting - Clothing Manipulation and hygiene: with modified independence;sit to/from stand  OT Frequency: Min 2X/week       AM-PAC OT "6 Clicks" Daily Activity     Outcome Measure Help from another person eating meals?: None Help from another person taking care of personal grooming?: None Help from another person toileting, which includes using toliet, bedpan, or urinal?: A Little Help from another person bathing (including washing, rinsing, drying)?: A Little Help from another person to put on and taking off regular upper body clothing?: None Help from another person to put on and taking off regular lower body clothing?: A Little 6 Click Score: 21   End of Session Equipment Utilized During Treatment: Rolling walker (2 wheels) Nurse Communication: Mobility status  Activity Tolerance: Patient tolerated treatment well Patient left: in bed;with call bell/phone within reach;with bed alarm set  OT Visit Diagnosis: Unsteadiness on feet (R26.81);Muscle weakness (generalized) (M62.81)                Time: 4580-9983 OT Time Calculation (min): 38 min Charges:  OT General Charges $OT  Visit: 1 Visit OT Evaluation $OT Eval Moderate Complexity: 1 Mod OT Treatments $Self Care/Home Management : 8-22 mins  Darleen Crocker, MS, OTR/L , CBIS ascom [phone]  08/10/22, 11:58 AM

## 2022-08-10 NOTE — Progress Notes (Signed)
Physical Therapy Treatment Patient Details Name: Susan Chung MRN: [id number] DOB: 1950/09/24 Today's Date: 08/10/2022   History of Present Illness presented to ER secondary to acute onset of R LE weakness, R facial numbness after vertiginous episode; admitted for TIA/CVA work up (imaging unremarkable for acute stroke) and additional medical work up to determine etiology of R LE weakness (CNS versus peripheral cause?).  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    PT Comments    Stair training with patient/daughter Hollie Beach) prior to discharge.  Educated in technique (using bilat rails, bilat UEs on single rail and single rail/HHA), proper guarding/assist techniques, importance/role of R TKE in loading/modified SLS.  Patient able to complete all trials with no greater than min assist +1; does require exaggerated effort, increased time to advance R LE.  Patient/daughter comfortable with performance and level of assist required; no additional training required at this time.  Eager for discharge when cleared by MD.     Recommendations for follow up therapy are one component of a multi-disciplinary discharge planning process, led by the attending physician.  Recommendations may be updated based on patient status, additional functional criteria and insurance authorization.  Follow Up Recommendations  Home health PT     Assistance Recommended at Discharge Intermittent Supervision/Assistance  Patient can return home with the following A little help with walking and/or transfers;A little help with bathing/dressing/bathroom   Equipment Recommendations  Wheelchair (measurements PT);Wheelchair cushion (measurements PT)    Recommendations for Other Services       Precautions / Restrictions Precautions Precautions: Fall Restrictions Weight Bearing Restrictions: No     Mobility  Bed  Mobility               General bed mobility comments: seated edge of bed beginning/end of treatment session; family at bedside    Transfers Overall transfer level: Needs assistance Equipment used: Rolling walker (2 wheels) Transfers: Sit to/from Stand Sit to Stand: Min assist           General transfer comment: educated on importance of symmetrical foot placement and active use of R LE as appropriate; hand placement to prevent pulling on RW    Ambulation/Gait Ambulation/Gait assistance: Min Web designer (Feet): 20 Feet Assistive device: Rolling walker (2 wheels)         General Gait Details: 3-point, step to gait pattern leading with R LE;cuing/assist for walker position (improves with cuing).  Slow and deliberate in stepping pattern.  Excessive L lateral lean/weight shift to unweight/advance R LE (due to limited hip/knee/ankle DF in swing); consistent cuing for R TKE in loading (to prevent buckling).   Stairs Stairs: Yes Stairs assistance: Min assist Stair Management: Two rails, One rail Left Number of Stairs: 4 General stair comments: Step-to gait pattern, ascending with L LE, descending wtih R LE (patient with good recall of technique).  Slow and deliberate, exaggerated effort to clear R LE; min cuing for recall of R TKE in loading.  Initial trial with bilat rails, min assist; second trial 2 steps with bilat UEs on L acending rail, 2 steps with single rail and daughter/HHA assist, min assist.  Daughter with good return demonstration of technique and guarding/assist; both patient/daughter comfortable with level of assist at discharge.   Wheelchair Mobility    Modified Rankin (Stroke Patients Only)       Balance Overall balance assessment: Needs assistance Sitting-balance support: No upper extremity  supported, Feet supported       Standing balance support: Bilateral upper extremity supported Standing balance-Leahy Scale: Fair                               Cognition Arousal/Alertness: Awake/alert Behavior During Therapy: WFL for tasks assessed/performed Overall Cognitive Status: Within Functional Limits for tasks assessed                                          Exercises Other Exercises Other Exercises: Reviewed technique for car transfer; patient/daughter voiced understanding and agreement. Other Exercises: Issued gait belt for use at discharge as needed.    General Comments        Pertinent Vitals/Pain Pain Assessment Pain Assessment: No/denies pain    Home Living Family/patient expects to be discharged to:: Private residence Living Arrangements: Spouse/significant other Available Help at Discharge: Family Type of Home: House Home Access: Stairs to enter Entrance Stairs-Rails: Left Entrance Stairs-Number of Steps: 3   Home Layout: One level Home Equipment:  (has access to RW, BSC from family; husband has WC. Patient/family considering transport chair) Additional Comments: Pt has access to RW and BSC from family/friends    Prior Function            PT Goals (current goals can now be found in the care plan section) Acute Rehab PT Goals Patient Stated Goal: to return home PT Goal Formulation: With patient/family Time For Goal Achievement: 08/24/22 Potential to Achieve Goals: Good Progress towards PT goals: Progressing toward goals    Frequency    7X/week      PT Plan Current plan remains appropriate    Co-evaluation              AM-PAC PT "6 Clicks" Mobility   Outcome Measure  Help needed turning from your back to your side while in a flat bed without using bedrails?: None Help needed moving from lying on your back to sitting on the side of a flat bed without using bedrails?: A Little Help needed moving to and from a bed to a chair (including a wheelchair)?: A Little Help needed standing up from a chair using your arms (e.g., wheelchair or bedside chair)?: A  Little Help needed to walk in hospital room?: A Little Help needed climbing 3-5 steps with a railing? : A Little 6 Click Score: 19    End of Session Equipment Utilized During Treatment: Gait belt Activity Tolerance: Patient tolerated treatment well Patient left:  (mobility specialist assisting with return to ER room) Nurse Communication: Mobility status PT Visit Diagnosis: Other symptoms and signs involving the nervous system (R29.898);Muscle weakness (generalized) (M62.81)     Time: 2820-6015 PT Time Calculation (min) (ACUTE ONLY): 24 min  Charges:  $Gait Training: 23-37 mins $Therapeutic Activity: 8-22 mins                      Kayron Hicklin H. Owens Shark, PT, DPT, NCS 08/10/22, 2:43 PM [phone]

## 2022-08-10 NOTE — Consult Note (Signed)
Consulting Department:  ED  Primary Physician:  Adin Hector, MD  Chief Complaint: Right lower extremity weakness/stiffness  History of Present Illness: 08/10/2022 Susan Chung is a 72 y.o. female who presents with the chief complaint of right lower extremity weakness/stiffness.  She states that she has had difficulty with chronic neck pain and chronic back pain.  She has been followed by pain team and has had nerve ablations on her neck.  She is not had any surgical consultations as of this time.  She was in her normal state of health until she had a severe episode of vertigo for which she is predisposed.  She states that ever since that time she did have significant changes to the right side of her body.  Notably she has had both a TIA and a cerebellar stroke in the past.  She feels a loss of sensation in her right lower extremity as well as weakness and loss of coordination.  She has not noticed any new changes in her bowel or bladder function.  She is very mild symptoms on her contralateral side in the lower extremity.  The symptoms are causing a significant impact on the patient's life.  And she is being admitted for extremity weakness.  Review of Systems:  A 10 point review of systems is negative, except for the pertinent positives and negatives detailed in the HPI.  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   Cervical herniated disc    Diabetes mellitus without complication ([place])    Hypertension     Past Surgical History: Past Surgical History:  Procedure Laterality Date   LOOP RECORDER INSERTION N/A 11/25/2017   Procedure: LOOP RECORDER INSERTION;  Surgeon: Isaias Cowman, MD;  Location: [facility];  Service: Cardiovascular;  Laterality: N/A;    Allergies: Allergies as of 08/09/2022 - Review Complete 08/09/2022  Allergen Reaction Noted    Sulfa antibiotics Rash 08/10/2018    Medications:  Current Facility-Administered Medications:    acetaminophen (TYLENOL) tablet 650 mg, 650 mg, Oral, Q6H PRN **OR** acetaminophen (TYLENOL) suppository 650 mg, 650 mg, Rectal, Q6H PRN, Clarnce Flock, MD   aspirin EC tablet 81 mg, 81 mg, Oral, Daily, Dione Plover, Annice Needy, MD   atorvastatin (LIPITOR) tablet 10 mg, 10 mg, Oral, Daily, Clarnce Flock, MD   levothyroxine (SYNTHROID) tablet 125 mcg, 125 mcg, Oral, Q0600, Clarnce Flock, MD   lisinopril (ZESTRIL) tablet 2.5 mg, 2.5 mg, Oral, Daily, Clarnce Flock, MD   metFORMIN (GLUCOPHAGE-XR) 24 hr tablet 1,000 mg, 1,000 mg, Oral, Q supper, Dione Plover, Annice Needy, MD   methocarbamol (ROBAXIN) 500 mg in dextrose 5 % 50 mL IVPB, 500 mg, Intravenous, Q6H PRN, Clarnce Flock, MD   ondansetron [facility]) tablet 4 mg, 4 mg, Oral, Q6H PRN **OR** ondansetron (ZOFRAN) injection 4 mg, 4 mg, Intravenous, Q6H PRN, Clarnce Flock, MD   oxyCODONE (Oxy IR/ROXICODONE) immediate release tablet 5 mg, 5 mg, Oral, Q4H PRN, Clarnce Flock, MD   pantoprazole (PROTONIX) EC tablet 40 mg, 40 mg, Oral, Daily, Dione Plover, Annice Needy, MD   polyethylene glycol (MIRALAX / GLYCOLAX) packet 17 g, 17 g, Oral, Daily PRN, Clarnce Flock, MD   traZODone (DESYREL) tablet 50 mg, 50 mg, Oral, QHS PRN, Clarnce Flock, MD   triamterene-hydrochlorothiazide (MAXZIDE-25) 37.5-25 MG per tablet 1 tablet, 1 tablet, Oral, Daily,  Clarnce Flock, MD  Current Outpatient Medications:    aspirin EC 81 MG tablet, Take 81 mg by mouth daily., Disp: , Rfl:    atorvastatin (LIPITOR) 20 MG tablet, Take 10 mg by mouth daily., Disp: , Rfl: 11   glipiZIDE (GLUCOTROL) 10 MG tablet, Take 10 mg by mouth daily before breakfast., Disp: , Rfl:    levothyroxine (SYNTHROID) 125 MCG tablet, Take 125 mcg by mouth daily before breakfast., Disp: , Rfl:    lisinopril (PRINIVIL,ZESTRIL) 2.5 MG tablet, Take 2.5 mg by mouth daily., Disp: , Rfl:     metFORMIN (GLUCOPHAGE-XR) 500 MG 24 hr tablet, Take 1,000 mg by mouth daily with supper., Disp: , Rfl:    omeprazole (PRILOSEC) 20 MG capsule, Take 20 mg by mouth daily., Disp: , Rfl:    triamterene-hydrochlorothiazide (MAXZIDE-25) 37.5-25 MG tablet, Take 1 tablet by mouth daily., Disp: , Rfl:    TRULICITY 2.63 FH/5.4TG SOPN, Inject 0.75 mg into the skin once a week., Disp: , Rfl:    acetaminophen (TYLENOL) 650 MG CR tablet, Take 1,300 mg by mouth every 8 (eight) hours as needed for pain. , Disp: , Rfl:    amoxicillin-clavulanate (AUGMENTIN) 875-125 MG tablet, Take 1 tablet by mouth 2 (two) times daily. (Patient not taking: Reported on 08/09/2022), Disp: , Rfl:    Social History: Social History   Tobacco Use   Smoking status: Never   Smokeless tobacco: Never  Substance Use Topics   Alcohol use: No   Drug use: Never    Family Medical History: Family History  Problem Relation Age of Onset   Breast cancer Neg Hx     Physical Examination: Vitals:   08/09/22 1459 08/09/22 2325  BP: (!) 136/59 (!) 141/61  Pulse: (!) 55 (!) 56  Resp: 18 16  Temp: 98 F (36.7 C) 98 F (36.7 C)  SpO2: 98% 98%     General: Patient is well developed, well nourished, calm, collected, and in no apparent distress.  NEUROLOGICAL:  General: In no acute distress.  But is understandably concerned about her right lower extremity weakness. Awake, alert, oriented to person, place, and time.  Pupils equal round and reactive to light.  Facial tone is symmetric.  Tongue protrusion is midline.   Strength: She was able to stand at bedside and transfer however with some difficulty.  States that she is having trouble to pivoting.  Feels that she does not have full control of her right lower extremity.  She is antigravity at the knee and with plantarflexion.  She does have weakness in hip flexion and dorsiflexion.  Clonus is not present.  Toes are upgoing.  She has 2+ reflexes at the bilateral patellas, and  minimal reflexes at the Achilles..    Gait is abnormal, has difficulty controlling her right lower extremity  Imaging: Narrative & Impression  CLINICAL DATA:  Acute bilateral leg weakness since yesterday.   EXAM: MRI LUMBAR SPINE WITHOUT CONTRAST   TECHNIQUE: Multiplanar, multisequence MR imaging of the lumbar spine was performed. No intravenous contrast was administered.   COMPARISON:  CT abdomen pelvis dated August 10, 2018.   FINDINGS: Segmentation:  Standard.   Alignment: Unchanged trace retrolisthesis at L1-L2 and mild anterolisthesis at L4-L5.   Vertebrae:  No fracture, evidence of discitis, or bone lesion.   Conus medullaris and cauda equina: Conus extends to the L2 level. Conus and cauda equina appear normal.   Paraspinal and other soft tissues: Negative.   Disc levels:   T12-L1:  Mild disc bulging.  No stenosis.   L1-L2:  Mild-to-moderate disc bulging.  No stenosis.   L2-L3:  Mild disc bulging.  No stenosis.   L3-L4: Mild disc bulging and bilateral facet arthropathy. No stenosis.   L4-L5: Disc uncovering and mild disc bulging. Superimposed small left foraminal disc protrusion encroaching on the exiting left L4 nerve root. Severe bilateral facet arthropathy. Mild spinal canal stenosis. No neuroforaminal stenosis.   L5-S1: Mild disc bulging with superimposed bulky right far lateral disc osteophyte complex. Mild bilateral facet arthropathy. No stenosis.   IMPRESSION: 1. Multilevel degenerative changes of the lumbar spine as described above. Small left foraminal disc protrusion at L4-L5 encroaching on the exiting left L4 nerve root. No high-grade stenosis or impingement.     Electronically Signed   By: Titus Dubin M.D.   On: 08/09/2022 14:08   Narrative & Impression  CLINICAL DATA:  Initial evaluation for neck pain, cervical radiculopathy.   EXAM: MRI CERVICAL SPINE WITHOUT CONTRAST   TECHNIQUE: Multiplanar, multisequence MR imaging of  the cervical spine was performed. No intravenous contrast was administered.   COMPARISON:  Prior study from 04/04/2017.   FINDINGS: Alignment: Straightening with reversal of the normal cervical lordosis, apex at C5-6. 3 mm degenerative anterolisthesis of C4 on C5.   Vertebrae: Vertebral body height maintained without acute or chronic fracture. Bone marrow signal intensity within normal limits. No discrete or worrisome osseous lesions. No significant abnormal marrow edema.   Cord: Normal signal and morphology.   Posterior Fossa, vertebral arteries, paraspinal tissues: Visualized brain and posterior fossa within normal limits. Craniocervical junction normal. Paraspinous and prevertebral soft tissues within normal limits. Normal intravascular flow voids seen within the vertebral arteries bilaterally.   Disc levels:   C2-C3: Negative interspace. Moderate left-sided facet arthrosis. No spinal stenosis. Foramina remain patent.   C3-C4: Central to left paracentral disc protrusion indents the ventral thecal sac (series 11, image 10). Mild spinal stenosis without frank cord impingement. Moderate left with mild right facet hypertrophy. Mild bilateral uncovertebral spurring. Resultant mild to moderate left C4 foraminal narrowing. Right neural foramen is patent.   C4-C5: Anterolisthesis. Irregular right paracentral disc osteophyte complex flattens and indents the ventral thecal sac (series 11, image 14). Secondary flattening of the ventral cord without cord signal changes. Moderate left facet hypertrophy. No more than mild spinal stenosis. Mild left C5 foraminal narrowing. Right neural foramen is patent.   C5-C6: Degenerative intervertebral disc space narrowing. Broad-based left paracentral disc osteophyte complex flattens and partially effaces the ventral thecal sac. Secondary cord flattening without cord signal changes. Mild spinal stenosis. Mild left C6 foraminal narrowing. Right  neural foramen remains patent.   C6-C7: Degenerative intervertebral disc space narrowing with circumferential disc osteophyte complex. Broad posterior component flattens and partially effaces the ventral thecal sac. Mild spinal stenosis. Minimal cord flattening without cord signal changes. Foramina remain patent.   C7-T1: Negative interspace. Moderate left-sided facet hypertrophy. No canal or foraminal stenosis.   IMPRESSION: 1. Multilevel cervical spondylosis with resultant mild diffuse spinal stenosis at C3-4 through C6-7. 2. Multifactorial degenerative changes with resultant multilevel foraminal narrowing as above. Notable findings include mild to moderate left C4 foraminal stenosis, with mild left C5 and C6 foraminal narrowing. 3. Moderate left-sided facet hypertrophy at C2-3 through C7-T1, which could contribute to underlying neck pain.     Electronically Signed   By: Jeannine Boga M.D.   On: 08/09/2022 22:41     Narrative & Impression  CLINICAL DATA:  Initial evaluation for  unable to walk or lift right lower extremity.   EXAM: MRI THORACIC SPINE WITHOUT CONTRAST   TECHNIQUE: Multiplanar, multisequence MR imaging of the thoracic spine was performed. No intravenous contrast was administered.   COMPARISON:  None available.   FINDINGS: Alignment: Physiologic with preservation of the normal thoracic kyphosis. No listhesis.   Vertebrae: Vertebral body height maintained without acute or chronic fracture. Bone marrow signal intensity mildly heterogeneous but overall within normal limits. No discrete or worrisome osseous lesions. Minimal discogenic reactive endplate changes noted at T5-6 through T8-9. Additional reactive endplate changes noted at L1-2. No other abnormal marrow edema.   Cord:  Normal signal and morphology.   Paraspinal and other soft tissues: Paraspinous soft tissues within normal limits. Multifocal cortical scarring noted about  the visualized left kidney.   Disc levels:   T1-2:  Unremarkable.   T2-3: Unremarkable.   T3-4:  Unremarkable.   T4-5:  Unremarkable.   T5-6: Left paracentral disc protrusion indents the left ventral thecal sac (series 23, image 20). Flattening of the left ventral cord without cord signal changes. No significant spinal stenosis. Foramina remain patent.   T6-7: Right paracentral to foraminal disc protrusion indents the right ventral thecal sac (series 23, image 23). No significant spinal stenosis or cord deformity. Foramina remain patent.   T7-8: Central to right paracentral disc protrusion indents the ventral thecal sac (series 23, image 26). Mild cord flattening without cord signal changes. No significant spinal stenosis. Foramina remain patent.   T8-9: Degenerative intervertebral disc space narrowing. Broad-based right paracentral disc protrusion flattens the ventral thecal sac (series 23, image 29). No significant spinal stenosis or cord deformity. Foramina remain patent.   T9-10: Mild disc bulge. Left paracentral disc protrusion flattens the left ventral thecal sac (series 23, image 33). Minimal cord flattening without cord signal changes. No significant spinal stenosis. Foramina remain patent.   T10-11: Disc desiccation with minimal disc bulge. Endplate spurring. No stenosis.   T11-12: Disc desiccation with minimal disc bulge. Endplate spurring. No stenosis.   T12-L1: Disc desiccation with mild disc bulge. Endplate spurring. No stenosis.   IMPRESSION: 1. No acute abnormality within the thoracic spine. No high-grade spinal stenosis or frank cord compression. No cord signal changes to suggest myelopathy. 2. Multilevel degenerative spondylosis with disc protrusions at T5-6 through T9-10 as above. Mild cord flattening at several levels without significant spinal stenosis.     Electronically Signed   By: Jeannine Boga M.D.   On: 08/09/2022 22:34      I have personally reviewed the images and agree with the above interpretation.  Labs:    Latest Ref Rng & Units 08/09/2022   10:23 AM 06/16/2019   10:36 AM 08/10/2018    7:56 PM  CBC  WBC 4.0 - 10.5 K/uL 9.9  8.7  10.0   Hemoglobin 12.0 - 15.0 g/dL 13.9  13.8  13.0   Hematocrit 36.0 - 46.0 % 43.8  40.9  40.0   Platelets 150 - 400 K/uL 219  185  249        Assessment and Plan: Ms. Mezera is a pleasant 72 y.o. female with a history of vertigo, TIAs, cerebellar infarct, type 2 diabetes, hypothyroidism.  She was in her usual state of health until she experienced a severe episode of vertigo the room was spinning.  After that she noticed a significant amount of right-sided numbness and tingling in her face as well as her arm and leg.  She then also developed significant weakness and  loss of coordination in her right lower extremity.  Because of this she was brought to the emergency department for further evaluation.  MRI was performed to evaluate for any stroke which was not seen, she had a complete spine MRI which showed no significant compressive lesions but did show evidence of spondylosis.  Her physical exam shows a right hip and dorsiflexion weakness but maintained strength in her plantarflexion and knee extension.  She has upgoing toes but lack of reflexes in her bilateral Achilles.  Her sensation deficits are stocking like and nondermatomal in the right lower extremity specifically.  At this point I do not see any compressive lesions that would cause her current symptoms.  She however does have significant neurologic deficits after vertiginous episode.  She does have some mild central changes in her thoracic spinal cord not at any levels of compression, although these are known compressive she may benefit from evaluation or review by neurology for consideration of an intrinsic cord level issue, however this would not account for her facial numbness.  Stevan Born, MD/MSCR Dept.  of Neurosurgery

## 2022-08-10 NOTE — Assessment & Plan Note (Signed)
CVD-continue atorvastatin, aspirin Thyroidism-continue Synthroid Hypertension-continue lisinopril, Maxzide DM2-continue metformin, hold glipizide/Trulicity while admitted GERD-continue PPI

## 2022-08-10 NOTE — ED Notes (Signed)
Provided pt with tooth paste, tooth brush, water, and recliner for family member.

## 2022-08-10 NOTE — Evaluation (Signed)
Physical Therapy Evaluation Patient Details Name: Susan Chung MRN: [id number] DOB: Jun 17, 1951 Today's Date: 08/10/2022  History of Present Illness  presented to ER secondary to acute onset of R LE weakness, R facial numbness after vertiginous episode; admitted for TIA/CVA work up (imaging unremarkable for acute stroke) and additional medical work up to determine etiology of R LE weakness (CNS versus peripheral cause?).  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  Clinical Impression  Patient seated edge of bed upon arrival to room; received in hand-off from OT evaluation.  Patient alert and oriented, follows commands; agreeable to participation with session.  Daughter at bedside; very supportive and encouraging.  Patient denies pain, and does endorse improvement in R LE strength/sensation since admission (and administration of steroid).  Formal strength testing reveals residual weakness to R LE (detailed below) with persistent paresthesia R ankle distally.  Currently requiring min assist for sit/stand, standing balance, transfers and gait (20') with RW.  Demonstrates 3-point, step to gait pattern leading with R LE;cuing/assist for walker position (improves with cuing). Slow and deliberate in stepping pattern. Excessive L lateral lean/weight shift to unweight/advance R LE (due to limited hip/knee/ankle DF in swing); consistent cuing for R TKE in loading (to prevent buckling).  Do recommend continued use of RW and +1 assist for all mobility at this point; patient/family voiced agreement and understanding. Prepared to provide necessary support at home. Would benefit from skilled PT to address above deficits and promote optimal return to PLOF.; Recommend transition to HHPT upon discharge from acute hospitalization.  Discussed use of transport chair/manual WC for longer, community distances;  patient/family to discuss.  Have access ot RW and BSC (x2) from family.     Recommendations for follow up therapy are one component of a multi-disciplinary discharge planning process, led by the attending physician.  Recommendations may be updated based on patient status, additional functional criteria and insurance authorization.  Follow Up Recommendations Home health PT      Assistance Recommended at Discharge Intermittent Supervision/Assistance  Patient can return home with the following  A little help with walking and/or transfers;A little help with bathing/dressing/bathroom    Equipment Recommendations Wheelchair (measurements PT);Wheelchair cushion (measurements PT)  Recommendations for Other Services       Functional Status Assessment Patient has had a recent decline in their functional status and demonstrates the ability to make significant improvements in function in a reasonable and predictable amount of time.     Precautions / Restrictions Precautions Precautions: Fall Restrictions Weight Bearing Restrictions: No      Mobility  Bed Mobility               General bed mobility comments: seated edge of bed beginning/end of treatment session; family at bedside    Transfers Overall transfer level: Needs assistance Equipment used: Rolling walker (2 wheels) Transfers: Sit to/from Stand Sit to Stand: Min assist           General transfer comment: educated on importance of symmetrical foot placement and active use of R LE as appropriate; hand placement to prevent pulling on RW    Ambulation/Gait Ambulation/Gait assistance: Min assist Gait Distance (Feet): 20 Feet Assistive device: Rolling walker (2 wheels)         General Gait Details: 3-point, step to gait pattern leading with R LE;cuing/assist for walker position (improves with cuing).  Slow and deliberate in stepping pattern.  Excessive L  lateral lean/weight shift to unweight/advance R LE (due to  limited hip/knee/ankle DF in swing); consistent cuing for R TKE in loading (to prevent buckling).  Stairs            Wheelchair Mobility    Modified Rankin (Stroke Patients Only)       Balance Overall balance assessment: Needs assistance Sitting-balance support: No upper extremity supported, Feet supported       Standing balance support: Bilateral upper extremity supported Standing balance-Leahy Scale: Fair                               Pertinent Vitals/Pain Pain Assessment Pain Assessment: No/denies pain    Home Living Family/patient expects to be discharged to:: Private residence Living Arrangements: Spouse/significant other Available Help at Discharge: Family Type of Home: House Home Access: Stairs to enter Entrance Stairs-Rails: Left Entrance Stairs-Number of Steps: 3   Home Layout: One level Home Equipment:  (has access to RW, BSC from family; husband has WC. Patient/family considering transport chair)      Prior Function Prior Level of Function : Independent/Modified Independent             Mobility Comments: Indep with ADLs, household and community mobilization; physically active at baseline.  Denies fall history.       Hand Dominance   Dominant Hand: Right    Extremity/Trunk Assessment   Upper Extremity Assessment Upper Extremity Assessment: Overall WFL for tasks assessed    Lower Extremity Assessment Lower Extremity Assessment:  (R hip flex 2-/4, abduct/adduct/ext at least 3-/5; R knee flex/ext 3-/5; R ankle DF/PF 3-/5. Endorses generalized paresthesia ankle distally R foot; no additional sensory changes.  L LE Grossly WFL)       Communication   Communication: No difficulties  Cognition Arousal/Alertness: Awake/alert Behavior During Therapy: WFL for tasks assessed/performed Overall Cognitive Status: Within Functional Limits for tasks assessed                                          General Comments       Exercises     Assessment/Plan    PT Assessment Patient needs continued PT services  PT Problem List Decreased strength;Decreased range of motion;Decreased activity tolerance;Decreased balance;Decreased mobility;Decreased coordination;Decreased cognition;Decreased knowledge of use of DME;Decreased safety awareness;Decreased knowledge of precautions;Impaired sensation       PT Treatment Interventions DME instruction;Gait training;Stair training;Functional mobility training;Therapeutic activities;Therapeutic exercise;Balance training;Cognitive remediation;Patient/family education    PT Goals (Current goals can be found in the Care Plan section)  Acute Rehab PT Goals Patient Stated Goal: to return home PT Goal Formulation: With patient/family Time For Goal Achievement: 08/24/22 Potential to Achieve Goals: Good    Frequency 7X/week     Co-evaluation               AM-PAC PT "6 Clicks" Mobility  Outcome Measure Help needed turning from your back to your side while in a flat bed without using bedrails?: None Help needed moving from lying on your back to sitting on the side of a flat bed without using bedrails?: A Little Help needed moving to and from a bed to a chair (including a wheelchair)?: A Little Help needed standing up from a chair using your arms (e.g., wheelchair or bedside chair)?: A Little Help needed to walk in hospital room?: A Little Help  needed climbing 3-5 steps with a railing? : A Little 6 Click Score: 19    End of Session Equipment Utilized During Treatment: Gait belt Activity Tolerance: Patient tolerated treatment well Patient left: in bed;with call bell/phone within reach;with family/visitor present;with nursing/sitter in room Nurse Communication: Mobility status PT Visit Diagnosis: Other symptoms and signs involving the nervous system (R29.898);Muscle weakness (generalized) (M62.81)    Time: 0623-7628 PT Time Calculation (min) (ACUTE ONLY): 38  min   Charges:   PT Evaluation $PT Eval Moderate Complexity: 1 Mod PT Treatments $Therapeutic Activity: 8-22 mins        Lauralie Blacksher H. Owens Shark, PT, DPT, NCS 08/10/22, 11:17 AM [phone]

## 2022-08-10 NOTE — Assessment & Plan Note (Signed)
Unclear etiology, no upper motor neuron signs on exam.  Despite lack of right-sided findings most strongly suspect peripheral nerve compression given lower motor neuron signs on exam.  No acute imaging findings to suggest CNS lesion as the cause. - Neurosurgery consultation pending, follow-up - PT/OT consultation in a.m. - Consider outpatient neurology eval pending clinical course

## 2022-08-13 ENCOUNTER — Other Ambulatory Visit: Payer: Self-pay | Admitting: Internal Medicine

## 2022-08-13 DIAGNOSIS — R102 Pelvic and perineal pain: Secondary | ICD-10-CM

## 2022-08-14 ENCOUNTER — Ambulatory Visit
Admission: RE | Admit: 2022-08-14 | Discharge: 2022-08-14 | Disposition: A | Discharge status: Home / Self Care | Payer: Medicare HMO | Source: Ambulatory Visit | Attending: Internal Medicine | Admitting: Internal Medicine

## 2022-08-14 DIAGNOSIS — R102 Pelvic and perineal pain: Secondary | ICD-10-CM | POA: Diagnosis not present

## 2022-08-21 ENCOUNTER — Telehealth: Payer: Self-pay

## 2022-08-21 NOTE — Telephone Encounter (Signed)
Dr Tamala Julian saw this patient as a consult on 08/10/22. Per his sign out message: "history of multiple strokes, and right sided weakness. Seen for possible spondylosis but unlikely to be from compression. Should see [place] in clinic for degen issues though as she may need surgery in future.  - did formal consult w/ note"

## 2022-08-22 NOTE — Telephone Encounter (Signed)
Patient declined referral at this time. She has seen her PCP, she saw Dr.Shah, and currently doing PT. At this moment she will continue with them and if they feel she needs to see neurosurgery she will call the office.

## 2022-08-22 NOTE — Telephone Encounter (Signed)
Noted  

## 2022-09-11 ENCOUNTER — Ambulatory Visit (INDEPENDENT_AMBULATORY_CARE_PROVIDER_SITE_OTHER): Payer: Medicare HMO | Admitting: Urology

## 2022-09-11 VITALS — BP 115/70 | HR 61 | Ht 64.0 in | Wt 202.0 lb

## 2022-09-11 DIAGNOSIS — N3941 Urge incontinence: Secondary | ICD-10-CM

## 2022-09-11 DIAGNOSIS — N39498 Other specified urinary incontinence: Secondary | ICD-10-CM

## 2022-09-11 DIAGNOSIS — N390 Urinary tract infection, site not specified: Secondary | ICD-10-CM

## 2022-09-11 DIAGNOSIS — Z87440 Personal history of urinary (tract) infections: Secondary | ICD-10-CM | POA: Diagnosis not present

## 2022-09-11 LAB — BLADDER SCAN AMB NON-IMAGING: Scan Result: 23

## 2022-09-11 NOTE — Progress Notes (Signed)
Susan Chung,acting as a scribe for Susan Espy, MD.,have documented all relevant documentation on the behalf of Susan Espy, MD,as directed by  Susan Espy, MD while in the presence of Susan Espy, MD.  09/11/2022 9:22 AM   Chung Susan [birth year] [id number]  Referring provider: Adin Hector, MD [facility] [place],  [place] [address]  No chief complaint on file.   HPI: 72 year-old female with a history of multiple strokes and right-sided weakness who presents today for further evaluation of urinary incontinence and possible recurrent UTIs.   She has been seen and evaluated by her primary care provider for suprapubic pain. She gave a urine sample on 08/11/2022 which was nitrate positive, 42 WBCs and bacteria. She gave another sample on 08/22/2022 which was also moderately suspicious with the presence of 3+ leukocytes. A culture of this sample grew E.Coli. She was treated with macrobid twice daily for 7 days.   There was a concern for urinary retention and she had a renal ultrasound on 08/14/2022 that demonstrated near complete bladder emptying and no significant upper tract pathology.   She is a diabetic. She is on Glipizide, Metformin, and Ozempic.   Her urinalysis today is negative.   She reports that she had a stroke on 08/08/2022 which she feels was more significant than her previous ones. Initially, she experienced right-sided weakness, numbness in her hands and feet, and incontinence. However, her symptoms have been improving. She is going to physical therapy.   Today, she reports continued improvement in her symptoms. She denies any bed-wetting but has been getting up at least once a night to use the bathroom. She does not recall having incontinence before her strokes. She reports a history of recurrent UTIs.   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 If you have any questions, ask your nurse or doctor.          STOP taking these medications    Trulicity [id number] [id number] Sopn Generic drug: Dulaglutide       TAKE these medications    acetaminophen 650 MG CR tablet Commonly known as: TYLENOL Take 1,300 mg by mouth every 8 (eight) hours as needed for pain.   aspirin EC 81 MG tablet Take 81 mg by mouth daily.   atorvastatin 20 MG tablet Commonly known as: LIPITOR Take 10 mg by mouth daily.   gabapentin 100 MG capsule Commonly known as: NEURONTIN Take 2 capsules (200 mg total) by mouth at bedtime.   glipiZIDE 10 MG tablet Commonly  known as: GLUCOTROL Take 10 mg by mouth daily before breakfast.   levothyroxine 125 MCG tablet Commonly known as: SYNTHROID Take 125 mcg by mouth daily before breakfast.   lisinopril 2.5 MG tablet Commonly known as: ZESTRIL Take 2.5 mg by mouth daily.   metFORMIN 500 MG 24 hr tablet Commonly known as: GLUCOPHAGE-XR Take 1,000 mg by mouth daily with supper.   omeprazole 20 MG capsule Commonly known as: PRILOSEC Take 20 mg by mouth daily.   Semaglutide (2 MG/DOSE) 8 MG/3ML Sopn Inject into the skin.   triamterene-hydrochlorothiazide 37.5-25 MG tablet Commonly known as:  MAXZIDE-25 Take 1 tablet by mouth daily.        Allergies:  Allergies  Allergen Reactions   Sulfa Antibiotics Rash    Family History: Family History  Problem Relation Age of Onset   Breast cancer Neg Hx     Social History:  reports that she has never smoked. She has never used smokeless tobacco. She reports that she does not drink alcohol and does not use drugs.   Physical Exam: BP 115/70   Pulse 61   Ht 5' 4"$  (1.626 m)   Wt 202 lb (91.6 kg)   BMI 34.67 kg/m   Constitutional:  Alert and oriented, No acute distress. HEENT: [place] AT, moist mucus membranes.  Trachea midline, no masses. Neurologic: Grossly intact, no focal deficits, moving all 4 extremities. Psychiatric: Normal mood and affect.  US RENAL  Narrative CLINICAL DATA:  Suprapubic pain, evaluate for bladder retention  EXAM: RENAL / URINARY TRACT ULTRASOUND COMPLETE  COMPARISON:  CT abdomen and pelvis August 10, 2018  FINDINGS: Right Kidney:  Renal measurements: 11.5 x 4.9 x 5.4 cm = volume: 159.9 mL. Echogenicity within normal limits. No mass or hydronephrosis visualized.  Left Kidney:  Renal measurements: 10.1 x 5.6 x 6.0 cm = volume: 177.5 mL. Mild focal cortical thinning of the inferior pole. Echogenicity within normal limits. No mass or hydronephrosis visualized.  Bladder:  Appears normal for degree of bladder distention. Prevoid volume is 265.2 mL. Postvoid volume is 19.2 mL.  Other:  None.  IMPRESSION: 1. Near-complete bladder emptying. 2. No hydronephrosis.   Electronically Signed By: Beryle Flock M.D. On: 08/14/2022 11:27   This was personally reviewed and I agree with the radiologic interpretation.  Assessment & Plan:    Urinary incontinence/Urge - Improving with improvement of her right-sided weakness.  - She is having infrequent episodes at this point. - Adequate bladder emptying. - Urinalysis is negative.  - We discussed starting a medication for OAB. At this  point, in light of her recent improvement, we will abstain. - She will return as needed.   2. Recurrent UTI - Infrequent UTIs - Her urinalysis remains negative. - We will have her follow up as needed. - No indication for other diagnostic evaluation. - Renal ultrasound was negatively reassuring.  -We will hold off on any prevention medications at this time.   Return if symptoms worsen or fail to improve.   [facility] [address], [place],  [address] [phone]

## 2022-09-12 LAB — MICROSCOPIC EXAMINATION

## 2022-09-12 LAB — URINALYSIS, COMPLETE
Bilirubin, UA: NEGATIVE
Glucose, UA: NEGATIVE
Ketones, UA: NEGATIVE
Nitrite, UA: NEGATIVE
Protein,UA: NEGATIVE
RBC, UA: NEGATIVE
Specific Gravity, UA: 1.015 (ref 1.005–1.030)
Urobilinogen, Ur: 0.2 mg/dL (ref 0.2–1.0)
pH, UA: 7.5 (ref 5.0–7.5)

## 2022-10-23 ENCOUNTER — Other Ambulatory Visit: Payer: Self-pay

## 2022-10-23 DIAGNOSIS — Z1231 Encounter for screening mammogram for malignant neoplasm of breast: Secondary | ICD-10-CM

## 2022-11-22 ENCOUNTER — Ambulatory Visit
Admission: RE | Admit: 2022-11-22 | Discharge: 2022-11-22 | Disposition: A | Discharge status: Home / Self Care | Payer: Medicare HMO | Source: Ambulatory Visit | Attending: Internal Medicine | Admitting: Internal Medicine

## 2022-11-22 DIAGNOSIS — Z1231 Encounter for screening mammogram for malignant neoplasm of breast: Secondary | ICD-10-CM | POA: Diagnosis present

## 2023-10-22 ENCOUNTER — Other Ambulatory Visit: Payer: Self-pay | Admitting: Internal Medicine

## 2023-10-22 DIAGNOSIS — Z1231 Encounter for screening mammogram for malignant neoplasm of breast: Secondary | ICD-10-CM

## 2023-11-27 ENCOUNTER — Ambulatory Visit
Admission: RE | Admit: 2023-11-27 | Discharge: 2023-11-27 | Disposition: A | Discharge status: Home / Self Care | Source: Ambulatory Visit | Attending: Internal Medicine | Admitting: Internal Medicine

## 2023-11-27 DIAGNOSIS — Z1231 Encounter for screening mammogram for malignant neoplasm of breast: Secondary | ICD-10-CM | POA: Diagnosis present

## 2023-12-09 ENCOUNTER — Ambulatory Visit: Admitting: Physician Assistant

## 2024-05-05 LAB — COLOGUARD: COLOGUARD: NEGATIVE

## 2024-06-22 ENCOUNTER — Other Ambulatory Visit: Payer: Self-pay | Admitting: Sports Medicine

## 2024-06-22 DIAGNOSIS — M25462 Effusion, left knee: Secondary | ICD-10-CM

## 2024-06-22 DIAGNOSIS — M25562 Pain in left knee: Secondary | ICD-10-CM

## 2024-06-22 DIAGNOSIS — M1712 Unilateral primary osteoarthritis, left knee: Secondary | ICD-10-CM

## 2024-06-23 ENCOUNTER — Ambulatory Visit
Admission: RE | Admit: 2024-06-23 | Discharge: 2024-06-23 | Disposition: A | Discharge status: Home / Self Care | Source: Ambulatory Visit | Attending: Sports Medicine | Admitting: Sports Medicine

## 2024-06-23 DIAGNOSIS — M25462 Effusion, left knee: Secondary | ICD-10-CM | POA: Diagnosis present

## 2024-06-23 DIAGNOSIS — M25562 Pain in left knee: Secondary | ICD-10-CM | POA: Diagnosis present

## 2024-06-23 DIAGNOSIS — M1712 Unilateral primary osteoarthritis, left knee: Secondary | ICD-10-CM | POA: Insufficient documentation

## 2024-08-21 ENCOUNTER — Encounter: Payer: Self-pay | Admitting: Urgent Care

## 2024-08-23 NOTE — Discharge Instructions (Signed)
  Instructions after Knee Arthroscopy    Susan Reffner P. Susan Chung, Jr., M.D.    Dept. of Orthopaedics & Sports Medicine [facility] [address] [place], [place]  [address]  Phone: [phone]   Fax: [phone]       [email or web address]       DIET: Drink plenty of non-alcoholic fluids & begin a light diet. Resume your normal diet the day after surgery.  ACTIVITY:  You may use crutches or a walker with weight-bearing as tolerated, unless instructed otherwise. You may wean yourself off of the walker or crutches as tolerated.  Begin doing gentle exercises. Exercising will reduce the pain and swelling, increase motion, and prevent muscle weakness.   Avoid strenuous activities or athletics for a minimum of 4-6 weeks after arthroscopic surgery. Do not drive or operate any equipment until instructed.  WOUND CARE:  Place one to two pillows under the knee the first day or two when sitting or lying.  Continue to use the ice packs periodically to reduce pain and swelling. The small incisions in your knee are closed with nylon stitches. The stitches will be removed in the office. The bulky dressing may be removed on the second day after surgery. DO NOT TOUCH THE STITCHES. Put a Band-Aid over each stitch. Do NOT use any ointments or creams on the incisions.  You may bathe or shower after the stitches are removed at the first office visit following surgery.  MEDICATIONS: You may resume your regular medications. Please take the pain medication as prescribed. Do not take pain medication on an empty stomach. Do not drive or drink alcoholic beverages when taking pain medications.  CALL THE OFFICE FOR: Temperature above 101 degrees Excessive bleeding or drainage on the dressing. Excessive swelling, coldness, or paleness of the toes. Persistent nausea and vomiting.  FOLLOW-UP:  You should have an appointment to return to the office in 7-10 days after surgery.       [facility] Department Directory         [email or web address]       [email or web address]          Cardiology  Appointments: [place] - [phone]  Endocrinology  Appointments: [place] [phone] [place] - [phone]  Gastroenterology  Appointments: [place] [phone] [place] - [phone]        General Surgery   Appointments: [facility]  Internal Medicine/Family Medicine  Appointments: [facility] [place] - [phone] [place] - [phone]  Metabolic and Weigh Loss Surgery  Appointments: [facility]        Neurology  Appointments: [place] [phone] [place] - [phone]  Neurosurgery  Appointments: [place]  Obstetrics & Gynecology  Appointments: [place] [phone] [place] - [phone]        Pediatrics  Appointments: [place] [phone] [place] - [phone]  Physiatry  Appointments: [place] [phone]  Physical Therapy  Appointments: [place] - [phone]        Podiatry  Appointments: [place] [phone] [place] - [phone]  Pulmonology  Appointments: [place]  Rheumatology  Appointments: [place] [phone]        [place] Location: [facility]  [address] [place], [place]  [address]  [place] Location: [facility] [address] [place], [place]  [address]  [place] Location: [facility] [address] [place], [place]  [address]

## 2024-08-26 NOTE — Progress Notes (Signed)
 Appeal letter completed.  James P. Hooten, Jr., M.D.

## 2024-08-27 ENCOUNTER — Encounter
Admission: RE | Admit: 2024-08-27 | Discharge: 2024-08-27 | Disposition: A | Source: Ambulatory Visit | Attending: Orthopedic Surgery | Admitting: Orthopedic Surgery

## 2024-08-27 ENCOUNTER — Other Ambulatory Visit: Payer: Self-pay

## 2024-08-27 DIAGNOSIS — Z01812 Encounter for preprocedural laboratory examination: Secondary | ICD-10-CM

## 2024-08-27 DIAGNOSIS — M25562 Pain in left knee: Secondary | ICD-10-CM

## 2024-08-27 DIAGNOSIS — Z01810 Encounter for preprocedural cardiovascular examination: Secondary | ICD-10-CM

## 2024-08-27 DIAGNOSIS — E119 Type 2 diabetes mellitus without complications: Secondary | ICD-10-CM

## 2024-08-27 HISTORY — DX: Unspecified osteoarthritis, unspecified site: M19.90

## 2024-08-27 HISTORY — DX: Pain in left knee: M25.562

## 2024-08-27 HISTORY — DX: Sleep apnea, unspecified: G47.30

## 2024-08-27 HISTORY — DX: Hypothyroidism, unspecified: E03.9

## 2024-08-27 HISTORY — DX: Gastro-esophageal reflux disease without esophagitis: K21.9

## 2024-08-27 NOTE — Patient Instructions (Addendum)
 Your procedure is scheduled on: 09/01/24 - Wednesday Report to the Registration Desk on the 1st floor of the [facility]. To find out your arrival time, please call [phone] between 1PM - 3PM on: 08/31/24 - Tuesday If your arrival time is 6:00 am, do not arrive before that time as the [facility] entrance doors do not open until 6:00 am.  REMEMBER: Instructions that are not followed completely may result in serious medical risk, up to and including death; or upon the discretion of your surgeon and anesthesiologist your surgery may need to be rescheduled.  Do not eat food after midnight the night before surgery.  No gum chewing or hard candies.  You may however, drink CLEAR liquids up to 2 hours before you are scheduled to arrive for your surgery. Do not drink anything within 2 hours of your scheduled arrival time.  Clear liquids include: - water   In addition, your doctor has ordered for you to drink the provided:  Gatorade G2 Drinking this carbohydrate drink up to two hours before surgery helps to reduce insulin  resistance and improve patient outcomes. Please complete drinking 2 hours before scheduled arrival time.  One week prior to surgery: Stop Anti-inflammatories (NSAIDS) such as Advil, Aleve, Ibuprofen, Motrin, Naproxen, Naprosyn and Aspirin  based products such as Excedrin, Goody's Powder, BC Powder. You may take Tylenol  if needed for pain up until the day of surgery.  Stop ANY OVER THE COUNTER supplements until after surgery - Folic Acid   metFORMIN  (GLUCOPHAGE ) - hold beginning 02/02.  OZEMPIC hold 7 days prior to surgery, hold dose on 08/28/24.  ON THE DAY OF SURGERY ONLY TAKE THESE MEDICATIONS WITH SIPS OF WATER:   hydroxychloroquine (PLAQUENIL)  levothyroxine  (SYNTHROID )  predniSONE (DELTASONE)    No Alcohol for 24 hours before or after surgery.  No Smoking including e-cigarettes for 24 hours before surgery.  No chewable tobacco products for at least 6 hours  before surgery.  No nicotine patches on the day of surgery.  Do not use any recreational drugs for at least a week (preferably 2 weeks) before your surgery.  Please be advised that the combination of cocaine and anesthesia may have negative outcomes, up to and including death. If you test positive for cocaine, your surgery will be cancelled.  On the morning of surgery brush your teeth with toothpaste and water, you may rinse your mouth with mouthwash if you wish. Do not swallow any toothpaste or mouthwash.  Use CHG Soap or wipes as directed on instruction sheet.  Do not wear jewelry, make-up, hairpins, clips or nail polish.  For welded (permanent) jewelry: bracelets, anklets, waist bands, etc.  Please have this removed prior to surgery.  If it is not removed, there is a chance that hospital personnel will need to cut it off on the day of surgery.  Do not wear lotions, powders, or perfumes.   Do not shave body hair from the neck down 48 hours before surgery.  Contact lenses, hearing aids and dentures may not be worn into surgery.  Do not bring valuables to the hospital. [facility] is not responsible for any missing/lost belongings or valuables.   Notify your doctor if there is any change in your medical condition (cold, fever, infection).  Wear comfortable clothing (specific to your surgery type) to the hospital.  After surgery, you can help prevent lung complications by doing breathing exercises.  Take deep breaths and cough every 1-2 hours. Your doctor may order a device called an  Incentive Spirometer to help you take deep breaths.  If you are being admitted to the hospital overnight, leave your suitcase in the car. After surgery it may be brought to your room.  In case of increased patient census, it may be necessary for you, the patient, to continue your postoperative care in the Same Day Surgery department.  If you are being discharged the day of surgery, you will not be  allowed to drive home. You will need a responsible individual to drive you home and stay with you for 24 hours after surgery.   If you are taking public transportation, you will need to have a responsible individual with you.  Please call the Pre-admissions Testing Dept. at [phone] if you have any questions about these instructions.  Surgery Visitation Policy:  Patients having surgery or a procedure may have two visitors.  Children under the age of 31 must have an adult with them who is not the patient.  Inpatient Visitation:    Visiting hours are 7 a.m. to 8 p.m. Up to four visitors are allowed at one time in a patient room. The visitors may rotate out with other people during the day.  One visitor age 76 or older may stay with the patient overnight and must be in the room by 8 p.m.   Merchandiser, Retail to address health-related social needs:  [email or web address]                                                                                                            Preparing for Surgery with CHLORHEXIDINE GLUCONATE (CHG) Soap  Chlorhexidine Gluconate (CHG) Soap  o An antiseptic cleaner that kills germs and bonds with the skin to continue killing germs even after washing  o Used for showering the night before surgery and morning of surgery  Before surgery, you can play an important role by reducing the number of germs on your skin.  CHG (Chlorhexidine gluconate) soap is an antiseptic cleanser which kills germs and bonds with the skin to continue killing germs even after washing.  Please do not use if you have an allergy to CHG or antibacterial soaps. If your skin becomes reddened/irritated stop using the CHG.  1. Shower the NIGHT BEFORE SURGERY with CHG soap.  2. If you choose to wash your hair, wash your hair first as usual with your normal shampoo.  3. After shampooing, rinse your hair and body thoroughly to remove the shampoo.  4. Use CHG  as you would any other liquid soap. You can apply CHG directly to the skin and wash gently with a clean washcloth.  5. Apply the CHG soap to your body only from the neck down. Do not use on open wounds or open sores. Avoid contact with your eyes, ears, mouth, and genitals (private parts). Wash face and genitals (private parts) with your normal soap.  6. Wash thoroughly, paying special attention to the area where your surgery will be performed.  7. Thoroughly rinse your body with warm water.  8. Do not  shower/wash with your normal soap after using and rinsing off the CHG soap.  9. Do not use lotions, oils, etc., after showering with CHG.  10. Pat yourself dry with a clean towel.  11. Wear clean pajamas to bed the night before surgery.  12. Place clean sheets on your bed the night of your shower and do not sleep with pets.  13. Do not apply any deodorants/lotions/powders.  14. Please wear clean clothes to the hospital.  15. Remember to brush your teeth with your regular toothpaste.

## 2024-08-31 ENCOUNTER — Inpatient Hospital Stay: Admission: RE | Admit: 2024-08-31

## 2024-09-01 ENCOUNTER — Encounter: Admission: RE | Payer: Self-pay | Source: Home / Self Care

## 2024-09-01 ENCOUNTER — Ambulatory Visit: Admission: RE | Admit: 2024-09-01 | Source: Home / Self Care | Admitting: Orthopedic Surgery

## 2024-09-02 ENCOUNTER — Inpatient Hospital Stay
Admission: RE | Admit: 2024-09-02 | Discharge: 2024-09-02 | Attending: Orthopedic Surgery | Admitting: Orthopedic Surgery

## 2024-09-02 DIAGNOSIS — Z01812 Encounter for preprocedural laboratory examination: Secondary | ICD-10-CM

## 2024-09-02 DIAGNOSIS — Z01810 Encounter for preprocedural cardiovascular examination: Secondary | ICD-10-CM

## 2024-09-02 LAB — BASIC METABOLIC PANEL WITH GFR
Anion gap: 9 (ref 5–15)
BUN: 15 mg/dL (ref 8–23)
CO2: 28 mmol/L (ref 22–32)
Calcium: 9.1 mg/dL (ref 8.9–10.3)
Chloride: 103 mmol/L (ref 98–111)
Creatinine, Ser: 0.72 mg/dL (ref 0.44–1.00)
GFR, Estimated: >60 mL/min
Glucose, Bld: 152 mg/dL — ABNORMAL HIGH (ref 70–99)
Potassium: 4.3 mmol/L (ref 3.5–5.1)
Sodium: 140 mmol/L (ref 135–145)

## 2024-09-03 MED ORDER — PROPOFOL 1000 MG/100ML IV EMUL
INTRAVENOUS | Status: AC
  Filled 2024-09-03: qty 100

## 2024-09-06 ENCOUNTER — Encounter: Admission: RE | Payer: Self-pay

## 2024-09-06 ENCOUNTER — Ambulatory Visit: Admission: RE | Admit: 2024-09-06 | Admitting: Orthopedic Surgery
